# Patient Record
Sex: Female | Born: 1975
Health system: Southern US, Community
[De-identification: ages and names within clinical notes are randomized; demographics above are authoritative.]

## PROBLEM LIST (undated history)

## (undated) DIAGNOSIS — Z299 Encounter for prophylactic measures, unspecified: Secondary | ICD-10-CM

## (undated) DIAGNOSIS — N760 Acute vaginitis: Secondary | ICD-10-CM

## (undated) DIAGNOSIS — IMO0002 Reserved for concepts with insufficient information to code with codable children: Secondary | ICD-10-CM

## (undated) DIAGNOSIS — R51 Headache: Secondary | ICD-10-CM

## (undated) DIAGNOSIS — N809 Endometriosis, unspecified: Secondary | ICD-10-CM

## (undated) DIAGNOSIS — R87619 Unspecified abnormal cytological findings in specimens from cervix uteri: Secondary | ICD-10-CM

## (undated) DIAGNOSIS — T7840XA Allergy, unspecified, initial encounter: Secondary | ICD-10-CM

## (undated) DIAGNOSIS — B9689 Other specified bacterial agents as the cause of diseases classified elsewhere: Secondary | ICD-10-CM

## (undated) DIAGNOSIS — B009 Herpesviral infection, unspecified: Secondary | ICD-10-CM

## (undated) DIAGNOSIS — Z789 Other specified health status: Secondary | ICD-10-CM

## (undated) DIAGNOSIS — D689 Coagulation defect, unspecified: Secondary | ICD-10-CM

## (undated) DIAGNOSIS — I82409 Acute embolism and thrombosis of unspecified deep veins of unspecified lower extremity: Secondary | ICD-10-CM

## (undated) DIAGNOSIS — Z8619 Personal history of other infectious and parasitic diseases: Secondary | ICD-10-CM

## (undated) DIAGNOSIS — Z3490 Encounter for supervision of normal pregnancy, unspecified, unspecified trimester: Secondary | ICD-10-CM

## (undated) HISTORY — DX: Unspecified abnormal cytological findings in specimens from cervix uteri: R87.619

## (undated) HISTORY — DX: Coagulation defect, unspecified: D68.9

## (undated) HISTORY — DX: Encounter for prophylactic measures, unspecified: Z29.9

## (undated) HISTORY — DX: Endometriosis, unspecified: N80.9

## (undated) HISTORY — DX: Personal history of other infectious and parasitic diseases: Z86.19

## (undated) HISTORY — DX: Herpesviral infection, unspecified: B00.9

## (undated) HISTORY — DX: Allergy, unspecified, initial encounter: T78.40XA

## (undated) HISTORY — DX: Acute embolism and thrombosis of unspecified deep veins of unspecified lower extremity: I82.409

## (undated) HISTORY — DX: Reserved for concepts with insufficient information to code with codable children: IMO0002

## (undated) HISTORY — DX: Encounter for supervision of normal pregnancy, unspecified, unspecified trimester: Z34.90

---

## 1998-03-05 ENCOUNTER — Emergency Department (HOSPITAL_COMMUNITY): Admission: EM | Admit: 1998-03-05 | Discharge: 1998-03-05 | Payer: Self-pay

## 1999-04-05 ENCOUNTER — Emergency Department (HOSPITAL_COMMUNITY): Admission: EM | Admit: 1999-04-05 | Discharge: 1999-04-05 | Payer: Self-pay | Admitting: Emergency Medicine

## 1999-04-06 ENCOUNTER — Encounter: Payer: Self-pay | Admitting: Emergency Medicine

## 1999-09-13 ENCOUNTER — Emergency Department (HOSPITAL_COMMUNITY): Admission: EM | Admit: 1999-09-13 | Discharge: 1999-09-13 | Payer: Self-pay | Admitting: *Deleted

## 1999-11-28 ENCOUNTER — Other Ambulatory Visit: Admission: RE | Admit: 1999-11-28 | Discharge: 1999-11-28 | Payer: Self-pay | Admitting: *Deleted

## 1999-11-28 ENCOUNTER — Encounter (INDEPENDENT_AMBULATORY_CARE_PROVIDER_SITE_OTHER): Payer: Self-pay

## 2000-05-11 ENCOUNTER — Other Ambulatory Visit: Admission: RE | Admit: 2000-05-11 | Discharge: 2000-05-11 | Payer: Self-pay | Admitting: Obstetrics and Gynecology

## 2001-04-08 ENCOUNTER — Other Ambulatory Visit: Admission: RE | Admit: 2001-04-08 | Discharge: 2001-04-08 | Payer: Self-pay | Admitting: Obstetrics and Gynecology

## 2002-04-12 ENCOUNTER — Other Ambulatory Visit: Admission: RE | Admit: 2002-04-12 | Discharge: 2002-04-12 | Payer: Self-pay | Admitting: Obstetrics and Gynecology

## 2003-04-25 ENCOUNTER — Other Ambulatory Visit: Admission: RE | Admit: 2003-04-25 | Discharge: 2003-04-25 | Payer: Self-pay | Admitting: Obstetrics and Gynecology

## 2004-04-23 ENCOUNTER — Other Ambulatory Visit: Admission: RE | Admit: 2004-04-23 | Discharge: 2004-04-23 | Payer: Self-pay | Admitting: Obstetrics and Gynecology

## 2005-01-30 ENCOUNTER — Other Ambulatory Visit: Admission: RE | Admit: 2005-01-30 | Discharge: 2005-01-30 | Payer: Self-pay | Admitting: Obstetrics and Gynecology

## 2005-12-25 ENCOUNTER — Other Ambulatory Visit: Admission: RE | Admit: 2005-12-25 | Discharge: 2005-12-25 | Payer: Self-pay | Admitting: Obstetrics and Gynecology

## 2008-05-25 HISTORY — PX: DILATION AND CURETTAGE OF UTERUS: SHX78

## 2008-06-27 ENCOUNTER — Encounter: Admission: RE | Admit: 2008-06-27 | Discharge: 2008-06-27 | Payer: Self-pay | Admitting: Emergency Medicine

## 2009-06-07 ENCOUNTER — Ambulatory Visit (HOSPITAL_COMMUNITY): Admission: RE | Admit: 2009-06-07 | Discharge: 2009-06-07 | Payer: Self-pay | Admitting: Obstetrics and Gynecology

## 2010-05-25 HISTORY — PX: BUNIONECTOMY: SHX129

## 2011-03-06 ENCOUNTER — Other Ambulatory Visit: Payer: Self-pay | Admitting: Internal Medicine

## 2011-03-06 DIAGNOSIS — Z1231 Encounter for screening mammogram for malignant neoplasm of breast: Secondary | ICD-10-CM

## 2011-03-13 ENCOUNTER — Ambulatory Visit
Admission: RE | Admit: 2011-03-13 | Discharge: 2011-03-13 | Disposition: A | Discharge status: Home / Self Care | Payer: 59 | Source: Ambulatory Visit | Attending: Internal Medicine | Admitting: Internal Medicine

## 2011-03-13 DIAGNOSIS — Z1231 Encounter for screening mammogram for malignant neoplasm of breast: Secondary | ICD-10-CM

## 2011-05-26 DIAGNOSIS — I82409 Acute embolism and thrombosis of unspecified deep veins of unspecified lower extremity: Secondary | ICD-10-CM

## 2011-05-26 HISTORY — DX: Acute embolism and thrombosis of unspecified deep veins of unspecified lower extremity: I82.409

## 2011-07-01 ENCOUNTER — Encounter (INDEPENDENT_AMBULATORY_CARE_PROVIDER_SITE_OTHER): Payer: 59 | Admitting: Vascular Surgery

## 2011-07-01 DIAGNOSIS — M79609 Pain in unspecified limb: Secondary | ICD-10-CM

## 2011-07-01 NOTE — Progress Notes (Unsigned)
Performed RLE venous duplex @ [facility] 07/01/2011, gave verbal positive preliminary results to Vikki Ports RN at [facility] @ about 4pm, per Dr. Charlsie Merles instructions patient was sent home and [facility] contacted Dr. Merri Brunette office, the patients PCP,  for them to take over care regarding the positive results. I spoke with Kennon Rounds at Dr. Hulan Fess office and gave verbal positive preliminary results @ about 4:15pm.

## 2011-07-07 NOTE — Procedures (Unsigned)
DUPLEX DEEP VENOUS EXAM - LOWER EXTREMITY  INDICATION:  Right lower extremity pain.  HISTORY:  Edema:  Yes. Trauma/Surgery:  Recent foot surgery. Pain:  Yes. PE:  No. Previous DVT:  No. Anticoagulants:  No. Other:  DUPLEX EXAM:               CFV   SFV   PopV  PTV    GSV               R  L  R  L  R  L  R   L  R  L Thrombosis    o  o  o     +     +      o Spontaneous   +  +  +     o     o      + Phasic        +  +  +     o     o      + Augmentation  +  +  D     o     o      + Compressible  +  +  +     o     o      + Competent     +  +  +                  +  Legend:  + - yes  o - no  p - partial  D - decreased  IMPRESSION: 1. Acute deep venous thrombosis present involving the right distal     superficial femoral vein, popliteal vein, posterior tibial, and     peroneal veins. 2. Remainder of the right lower extremity deep venous system and right     greater saphenous vein appear patent and compressible. 3. Contralateral common femoral vein is patent and compressible.  Preliminary results were given to Elige Radon, at [facility]. Instructions were given for patient to go home, and they would follow up with the patient.   _____________________________ Quita Skye. Hart Rochester, M.D.  SH/MEDQ  D:  07/01/2011  T:  07/01/2011  Job:  [id number]  CC:  Merri Brunette, M.D.

## 2011-08-13 ENCOUNTER — Encounter (INDEPENDENT_AMBULATORY_CARE_PROVIDER_SITE_OTHER): Payer: 59 | Admitting: Obstetrics and Gynecology

## 2011-08-13 DIAGNOSIS — N949 Unspecified condition associated with female genital organs and menstrual cycle: Secondary | ICD-10-CM

## 2011-08-13 DIAGNOSIS — Z202 Contact with and (suspected) exposure to infections with a predominantly sexual mode of transmission: Secondary | ICD-10-CM

## 2011-08-13 DIAGNOSIS — J029 Acute pharyngitis, unspecified: Secondary | ICD-10-CM

## 2011-08-17 ENCOUNTER — Encounter: Payer: 59 | Admitting: Obstetrics and Gynecology

## 2011-12-30 ENCOUNTER — Encounter: Payer: Self-pay | Admitting: Obstetrics and Gynecology

## 2011-12-30 ENCOUNTER — Ambulatory Visit (INDEPENDENT_AMBULATORY_CARE_PROVIDER_SITE_OTHER): Payer: 59 | Admitting: Obstetrics and Gynecology

## 2011-12-30 VITALS — BP 118/88 | Temp 98.1°F | Wt 191.0 lb

## 2011-12-30 DIAGNOSIS — R300 Dysuria: Secondary | ICD-10-CM

## 2011-12-30 DIAGNOSIS — Z113 Encounter for screening for infections with a predominantly sexual mode of transmission: Secondary | ICD-10-CM

## 2011-12-30 DIAGNOSIS — N926 Irregular menstruation, unspecified: Secondary | ICD-10-CM

## 2011-12-30 DIAGNOSIS — Z7901 Long term (current) use of anticoagulants: Secondary | ICD-10-CM | POA: Insufficient documentation

## 2011-12-30 DIAGNOSIS — B373 Candidiasis of vulva and vagina: Secondary | ICD-10-CM

## 2011-12-30 DIAGNOSIS — N898 Other specified noninflammatory disorders of vagina: Secondary | ICD-10-CM

## 2011-12-30 DIAGNOSIS — R102 Pelvic and perineal pain: Secondary | ICD-10-CM

## 2011-12-30 DIAGNOSIS — N949 Unspecified condition associated with female genital organs and menstrual cycle: Secondary | ICD-10-CM

## 2011-12-30 LAB — POCT WET PREP (WET MOUNT)
KOH Wet Prep POC: NEGATIVE
Trichomonas Wet Prep HPF POC: NEGATIVE

## 2011-12-30 LAB — POCT URINALYSIS DIPSTICK
Bilirubin, UA: NEGATIVE
Blood, UA: 50
Ketones, UA: NEGATIVE

## 2011-12-30 MED ORDER — DOXYCYCLINE HYCLATE 100 MG PO TABS: 100.0000 mg | ORAL_TABLET | Freq: Two times a day (BID) | ORAL | Status: AC

## 2011-12-30 NOTE — Progress Notes (Signed)
Color: brown, stringy  Odor: no Itching:yes Thin:yes Thick:no Fever:no Dyspareunia:yes Hx PID:no HX STD:yes genital herpes Pelvic Pain:yes Desires Gc/CT:yes Desires HIV,RPR,HbsAG:yes Pt had paragard inserted at the end of March and states since then she has problems with spotting and discharge after her period.

## 2011-12-30 NOTE — Progress Notes (Signed)
35 YO complains of vaginal irritation and itching for the past week.  Has noticed that after period will notice a brown discharge 4 days later and occasionally after intercourse.  Admits to increased dyspareunia and random pelvic pain that ranges from crampy to sharp and sometimes dull.  Pain is worse with exercise but only lasts for seconds or minutes.  Denies urinary symptoms but admits to problems with constipation.  O: Pelvic:  EGBUS-wnl, vagina-small blood, cervix-string visible, uterus-tender, ULNS,, adnexae-mildly tender bilaterally   Wet Prep: pH-4.5,  whiff-neg,  yeast +++ U/A-negative UPT-negative  A: Yeast Vaginitis     Irregular Bleeding     Anti-coagulated due to DVT     Pelvic Pain     Paragard IUD  P:  GC/CT, RPR, HIV-pending        pelvic U/S-pending           Doxycycline 100 mg  #14 bid x 7 days no refills       Terazol 7 Vaginal Cream # 1 tube 1 applicatorful pv qhs no refill  Dwyne Hasegawa, PA-C

## 2011-12-31 ENCOUNTER — Other Ambulatory Visit: Payer: Self-pay | Admitting: Obstetrics and Gynecology

## 2011-12-31 LAB — GC/CHLAMYDIA PROBE AMP, GENITAL: GC Probe Amp, Genital: NEGATIVE

## 2011-12-31 LAB — RPR

## 2011-12-31 LAB — HIV ANTIBODY (ROUTINE TESTING W REFLEX): HIV: NONREACTIVE

## 2011-12-31 MED ORDER — TERCONAZOLE 0.4 % VA CREA: 1.0000 | TOPICAL_CREAM | Freq: Every day | VAGINAL | Status: AC

## 2012-01-07 ENCOUNTER — Other Ambulatory Visit: Payer: Self-pay | Admitting: Obstetrics and Gynecology

## 2012-01-07 NOTE — Telephone Encounter (Signed)
Triage/rx °

## 2012-01-07 NOTE — Telephone Encounter (Signed)
Spoke with susan from [employer] states pt states suppose to have rx to stop bleeding states pt has rx for doxy and terezol advised susan that per EP note those were the only two rx prescribed at that visit susan will inform pt

## 2012-01-22 ENCOUNTER — Encounter: Payer: Self-pay | Admitting: Obstetrics and Gynecology

## 2012-01-22 ENCOUNTER — Ambulatory Visit (INDEPENDENT_AMBULATORY_CARE_PROVIDER_SITE_OTHER): Payer: 59

## 2012-01-22 ENCOUNTER — Ambulatory Visit (INDEPENDENT_AMBULATORY_CARE_PROVIDER_SITE_OTHER): Payer: 59 | Admitting: Obstetrics and Gynecology

## 2012-01-22 VITALS — BP 122/70 | Temp 99.0°F | Ht 66.0 in | Wt 193.0 lb

## 2012-01-22 DIAGNOSIS — N83209 Unspecified ovarian cyst, unspecified side: Secondary | ICD-10-CM

## 2012-01-22 DIAGNOSIS — N949 Unspecified condition associated with female genital organs and menstrual cycle: Secondary | ICD-10-CM

## 2012-01-22 DIAGNOSIS — R102 Pelvic and perineal pain: Secondary | ICD-10-CM

## 2012-01-22 DIAGNOSIS — Z01419 Encounter for gynecological examination (general) (routine) without abnormal findings: Secondary | ICD-10-CM

## 2012-01-22 DIAGNOSIS — Z124 Encounter for screening for malignant neoplasm of cervix: Secondary | ICD-10-CM

## 2012-01-22 DIAGNOSIS — N83299 Other ovarian cyst, unspecified side: Secondary | ICD-10-CM

## 2012-01-22 MED ORDER — HYDROCODONE-ACETAMINOPHEN 5-500 MG PO TABS: 1.0000 | ORAL_TABLET | Freq: Four times a day (QID) | ORAL | Status: AC | PRN

## 2012-01-22 NOTE — Progress Notes (Signed)
Regular Periods: yes Mammogram: yes  Monthly Breast Ex.: no Exercise: yes  Tetanus < 10 years: no Seatbelts: yes  NI. Bladder Functn.: yes Abuse at home: no  Daily BM's: yes Stressful Work: yes  Healthy Diet: yes Sigmoid-Colonoscopy: NO  Calcium: no Medical problems this year: F/U U/S   LAST PAP:2012  Contraception: PARAGARD IUD  Mammogram:  AGE 36  PCP: DR. PHARR  PMH: PT HAD DVT 2013  [facility]: NO CHANGE  Last Bone Scan: NO

## 2012-01-22 NOTE — Patient Instructions (Addendum)
Ovarian Cyst The ovaries are small organs that are on each side of the uterus. The ovaries are the organs that produce the female hormones, estrogen and progesterone. An ovarian cyst is a sac filled with fluid that can vary in its size. It is normal for a small cyst to form in women who are in the childbearing age and who have menstrual periods. This type of cyst is called a follicle cyst that becomes an ovulation cyst (corpus luteum cyst) after it produces the women's egg. It later goes away on its own if the woman does not become pregnant. There are other kinds of ovarian cysts that may cause problems and may need to be treated. The most serious problem is a cyst with cancer. It should be noted that menopausal women who have an ovarian cyst are at a higher risk of it being a cancer cyst. They should be evaluated very quickly, thoroughly and followed closely. This is especially true in menopausal women because of the high rate of ovarian cancer in women in menopause. CAUSES AND TYPES OF OVARIAN CYSTS:  FUNCTIONAL CYST: The follicle/corpus luteum cyst is a functional cyst that occurs every month during ovulation with the menstrual cycle. They go away with the next menstrual cycle if the woman does not get pregnant. Usually, there are no symptoms with a functional cyst.   ENDOMETRIOMA CYST: This cyst develops from the lining of the uterus tissue. This cyst gets in     or on the ovary. It grows every month from the bleeding during the menstrual period. It is also called a "chocolate cyst" because it becomes filled with blood that turns brown. This cyst can cause pain in the lower abdomen during intercourse and with your menstrual period.   CYSTADENOMA CYST: This cyst develops from the cells on the outside of the ovary. They usually are not cancerous. They can get very big and cause lower abdomen pain and pain with intercourse. This type of cyst can twist on itself, cut off its blood supply and cause severe  pain. It also can easily rupture and cause a lot of pain.   DERMOID CYST: This type of cyst is sometimes found in both ovaries. They are found to have different kinds of body tissue in the cyst. The tissue includes skin, teeth, hair, and/or cartilage. They usually do not have symptoms unless they get very big. Dermoid cysts are rarely cancerous.   POLYCYSTIC OVARY: This is a rare condition with hormone problems that produces many small cysts on both ovaries. The cysts are follicle-like cysts that never produce an egg and become a corpus luteum. It can cause an increase in body weight, infertility, acne, increase in body and facial hair and lack of menstrual periods or rare menstrual periods. Many women with this problem develop type 2 diabetes. The exact cause of this problem is unknown. A polycystic ovary is rarely cancerous.   THECA LUTEIN CYST: Occurs when too much hormone (human chorionic gonadotropin) is produced and over-stimulates the ovaries to produce an egg. They are frequently seen when doctors stimulate the ovaries for invitro-fertilization (test tube babies).   LUTEOMA CYST: This cyst is seen during pregnancy. Rarely it can cause an obstruction to the birth canal during labor and delivery. They usually go away after delivery.  SYMPTOMS   Pelvic pain or pressure.   Pain during sexual intercourse.   Increasing girth (swelling) of the abdomen.   Abnormal menstrual periods.   Increasing pain with menstrual periods.  You stop having menstrual periods and you are not pregnant.  DIAGNOSIS  The diagnosis can be made during:  Routine or annual pelvic examination (common).   Ultrasound.   X-ray of the pelvis.   CT Scan.   MRI.   Blood tests.  TREATMENT   Treatment may only be to follow the cyst monthly for 2 to 3 months with your caregiver. Many go away on their own, especially functional cysts.   May be aspirated (drained) with a long needle with ultrasound, or by  laparoscopy (inserting a tube into the pelvis through a small incision).   The whole cyst can be removed by laparoscopy.   Sometimes the cyst may need to be removed through an incision in the lower abdomen.   Hormone treatment is sometimes used to help dissolve certain cysts.   Birth control pills are sometimes used to help dissolve certain cysts.  HOME CARE INSTRUCTIONS  Follow your caregiver's advice regarding:  Medicine.   Follow up visits to evaluate and treat the cyst.   You may need to come back or make an appointment with another caregiver, to find the exact cause of your cyst, if your caregiver is not a gynecologist.   Get your yearly and recommended pelvic examinations and Pap tests.   Let your caregiver know if you have had an ovarian cyst in the past.  SEEK MEDICAL CARE IF:   Your periods are late, irregular, they stop, or are painful.   Your stomach (abdomen) or pelvic pain does not go away.   Your stomach becomes larger or swollen.   You have pressure on your bladder or trouble emptying your bladder completely.   You have painful sexual intercourse.   You have feelings of fullness, pressure, or discomfort in your stomach.   You lose weight for no apparent reason.   You feel generally ill.   You become constipated.   You lose your appetite.   You develop acne.   You have an increase in body and facial hair.   You are gaining weight, without changing your exercise and eating habits.   You think you are pregnant.  SEEK IMMEDIATE MEDICAL CARE IF:   You have increasing abdominal pain.   You feel sick to your stomach (nausea) and/or vomit.   You develop a fever that comes on suddenly.   You develop abdominal pain during a bowel movement.   Your menstrual periods become heavier than usual.  Document Released: 05/11/2005 Document Revised: 04/30/2011 Document Reviewed: 03/14/2009 [facility] Patient Information 2012 [place], [place].  Call office if you  have pain that does not improve with your pain medication or pain that is severe.

## 2012-01-22 NOTE — Progress Notes (Signed)
Subjective:    Kara Duffy is a 36 y.o. female, G1P0, who presents for an annual exam. The patient was seen 12/30/11 for pelvic pain and had an ultrasound today th at showed a complex left ovarian cyst-2.5 x 2.0 x 1.9 cm (dermoid cannot be ruled out).  Patient's IUD was in the proper position within the endometerial cavity per 3 D rendering.  Patient continues to have episodes of severe pelvic pain and with intercourse horrible.  This month has been the worse for her pain but it occurs randomly.   Admits that pain is aggravated by certain movements but knows of no initiating factors.   Menstrual cycle:   LMP: Patient's last menstrual period was 01/14/2012.              Review of Systems Pertinent items are noted in HPI. Denies pelvic pain, urinary tract symptoms, vaginitis symptoms, irregular bleeding, menopausal symptoms, change in bowel habits or rectal bleeding   Objective:    BP 122/70  Temp 99 F (37.2 C) (Oral)  Ht 5\' 6"  (1.676 m)  Wt 193 lb (87.544 kg)  BMI 31.15 kg/m2  LMP 01/14/2012   @Weigh @t :  Wt Readings from Last 1 Encounters:  01/22/12 193 lb (87.544 kg)   Body mass index is 31.15 kg/(m^2). General Appearance: Alert, no acute distress HEENT: Grossly normal Neck / Thyroid: Supple, no thyromegaly or cervical adenopathy Lungs: Clear to auscultation bilaterally Back: No CVA tenderness Breast Exam: No masses or nodes.No dimpling, nipple retraction or discharge. Cardiovascular: Regular rate and rhythm.  Gastrointestinal: Soft, non-tender, no masses or organomegaly Pelvic Exam: EGBUS-wnl, vagina-normal rugae, scant blood, cervix- without lesions or tenderness, string visible, uterus appears normal size shape and consistency, adnexae-no masses or tenderness Lymphatic Exam: Non-palpable nodes in neck, clavicular,  axillary, or inguinal regions  Skin: no rashes or abnormalities Extremities: no clubbing cyanosis or edema  Neurologic: grossly normal Psychiatric: Alert and  oriented    Assessment:   Routine GYN Exam Complex Left Ovarian Cyst Pelvic Pain Anticoagulated due to DVT   Plan:  Torsion precautions; reviewed management options  Vicodin # 30  1 po q 4-6 hours prn-pain no refills  PAP sent  Repeat U/S in 6 weeks  RTO 1 year or prn  Kameko Hukill,ELMIRAPA-C

## 2012-01-26 LAB — PAP IG W/ RFLX HPV ASCU

## 2012-03-07 ENCOUNTER — Encounter: Payer: Self-pay | Admitting: Obstetrics and Gynecology

## 2012-03-07 ENCOUNTER — Ambulatory Visit (INDEPENDENT_AMBULATORY_CARE_PROVIDER_SITE_OTHER): Payer: 59

## 2012-03-07 ENCOUNTER — Ambulatory Visit (INDEPENDENT_AMBULATORY_CARE_PROVIDER_SITE_OTHER): Payer: 59 | Admitting: Obstetrics and Gynecology

## 2012-03-07 VITALS — BP 112/70 | Ht 66.0 in | Wt 196.0 lb

## 2012-03-07 DIAGNOSIS — N83299 Other ovarian cyst, unspecified side: Secondary | ICD-10-CM

## 2012-03-07 DIAGNOSIS — N83202 Unspecified ovarian cyst, left side: Secondary | ICD-10-CM

## 2012-03-07 DIAGNOSIS — N83209 Unspecified ovarian cyst, unspecified side: Secondary | ICD-10-CM

## 2012-03-07 DIAGNOSIS — R102 Pelvic and perineal pain: Secondary | ICD-10-CM

## 2012-03-07 DIAGNOSIS — N949 Unspecified condition associated with female genital organs and menstrual cycle: Secondary | ICD-10-CM

## 2012-03-07 NOTE — Progress Notes (Signed)
Here to f/u u/s  Filed Vitals:   03/07/12 1643  BP: 112/70   Ultrasound today left ovarian cyst is unchanged with a left ovarian complex cyst that had the appearance of an endometrioma uterus measures 7.3 x 3.7 x 4.5 cm and right ovary measures 2.5 cm the left ovary was 3.9 cm  A/P Options and recs discussed Pt wants to discuss with husband Endometrioma - Plan will be if pt decides to proceed  - lap left cystectomy/ poss oophorectomy

## 2012-03-09 ENCOUNTER — Telehealth: Payer: Self-pay | Admitting: Obstetrics and Gynecology

## 2012-03-09 NOTE — Telephone Encounter (Signed)
Laparoscopic Left Cystectomy possible Oopherectomy scheduled for 03/30/12@ 9:00 with AR/EP. [employer] effective 05/25/09.  Plan pays 85/15 after a $1,250 deducitble. Pre-cert not required, Pre-op due $199.20 -Adrianne Pridgen

## 2012-03-11 ENCOUNTER — Other Ambulatory Visit: Payer: Self-pay | Admitting: Obstetrics and Gynecology

## 2012-03-15 ENCOUNTER — Telehealth: Payer: Self-pay | Admitting: Obstetrics and Gynecology

## 2012-03-15 ENCOUNTER — Encounter: Payer: Self-pay | Admitting: Obstetrics and Gynecology

## 2012-03-15 ENCOUNTER — Ambulatory Visit (INDEPENDENT_AMBULATORY_CARE_PROVIDER_SITE_OTHER): Payer: 59 | Admitting: Obstetrics and Gynecology

## 2012-03-15 ENCOUNTER — Encounter (HOSPITAL_COMMUNITY): Payer: Self-pay | Admitting: Pharmacist

## 2012-03-15 VITALS — BP 120/76 | Wt 197.0 lb

## 2012-03-15 DIAGNOSIS — A499 Bacterial infection, unspecified: Secondary | ICD-10-CM

## 2012-03-15 DIAGNOSIS — N898 Other specified noninflammatory disorders of vagina: Secondary | ICD-10-CM

## 2012-03-15 DIAGNOSIS — B379 Candidiasis, unspecified: Secondary | ICD-10-CM

## 2012-03-15 DIAGNOSIS — B49 Unspecified mycosis: Secondary | ICD-10-CM

## 2012-03-15 DIAGNOSIS — N760 Acute vaginitis: Secondary | ICD-10-CM

## 2012-03-15 LAB — POCT URINALYSIS DIPSTICK
Glucose, UA: NEGATIVE
Nitrite, UA: NEGATIVE
Urobilinogen, UA: NEGATIVE

## 2012-03-15 MED ORDER — METRONIDAZOLE 500 MG PO TABS: 500.0000 mg | ORAL_TABLET | Freq: Two times a day (BID) | ORAL | Status: DC

## 2012-03-15 MED ORDER — TERCONAZOLE 0.8 % VA CREA: 1.0000 | TOPICAL_CREAM | Freq: Every day | VAGINAL | Status: DC

## 2012-03-15 NOTE — Progress Notes (Signed)
Odor: no Fever: no Pelvic Pain: no  Itching: yes Dyspareunia: no Desires GC/CT: yes  Thin: yes History of PID: no Desires HIV,RPR,HbsAG: no  Thick: no History of STD: yes Other: Hsv 2   Vaginal Discharge/Discomfort/Itching  Subjective:   Kara Duffy is an 36 y.o. woman who presents c/o burning, itching   Symptoms have been present for 2 weeks. Associated symptoms: burning and general discomfort with change in vaginal secretions.  Contraception: condoms.  History of abnormal Pap smear: no STD history:  none Sexually transmitted infection risk: very low risk of STD exposure but has a history of HSV 2 Menstrual flow: regular every 28-30 days.  Pain: Labia are irritated from using vaginal wipes  Objective: no lesions, no discharge, no CMT, no adnexal masses B and discharge vaginal walls are irritated and the speculum examination was uncomfortable for this patient Vulvar exudate: pH 4.0 Vaginal lesions: none noted Wet Prep: PH 4.0, Yeast and Clue Cells noted  Assessment: Yeast and BV infection  Plan: Additional Tests:GC and Chlamydia genprobes Medications: antifungal and metronidazole Counseling:  diagnosis, lab results, treatment options, follow up plan and return instructions   Follow-up: as needed  DAVIES, DENISE, CNM10/22/2013 12:05 PM

## 2012-03-16 ENCOUNTER — Telehealth: Payer: Self-pay | Admitting: Obstetrics and Gynecology

## 2012-03-16 LAB — GC/CHLAMYDIA PROBE AMP
CT Probe RNA: NEGATIVE
GC Probe RNA: NEGATIVE

## 2012-03-17 NOTE — Telephone Encounter (Signed)
TC TO PT REGARDING MESSAGE. PT WANTED TO KNOW HOW LONG WILL SHE POSSIBLY BE OUT FOR SURGERY.I CALLED  ADRIANNE WHO CALLED AR AND AR SAID ABOUT 2 WEEKS PT WOULD BE OUT OF WORK. PT VOICED UNDERSTANDING.

## 2012-03-23 ENCOUNTER — Encounter (HOSPITAL_COMMUNITY)
Admission: RE | Admit: 2012-03-23 | Discharge: 2012-03-23 | Disposition: A | Discharge status: Home / Self Care | Payer: 59 | Source: Ambulatory Visit | Attending: Obstetrics and Gynecology | Admitting: Obstetrics and Gynecology

## 2012-03-23 ENCOUNTER — Encounter: Payer: 59 | Admitting: Obstetrics and Gynecology

## 2012-03-23 ENCOUNTER — Encounter (HOSPITAL_COMMUNITY): Payer: Self-pay

## 2012-03-23 HISTORY — DX: Other specified health status: Z78.9

## 2012-03-23 LAB — CBC
MCH: 30.2 pg (ref 26.0–34.0)
MCV: 89.9 fL (ref 78.0–100.0)
Platelets: 268 10*3/uL (ref 150–400)
RBC: 3.97 MIL/uL (ref 3.87–5.11)
RDW: 12.7 % (ref 11.5–15.5)

## 2012-03-23 NOTE — Patient Instructions (Addendum)
Your procedure is scheduled on:03/30/12  Enter through the Main Entrance at :0730am Pick up desk phone and dial [phone] and inform us of your arrival.  Please call [phone] if you have any problems the morning of surgery.  Remember: Do not eat or drink after midnight:Tuesday   Take these meds the morning of surgery with a sip of water:none  DO NOT wear jewelry, eye make-up, lipstick,body lotion, or dark fingernail polish. Do not shave for 48 hours prior to surgery.  Patients discharged on the day of surgery will not be allowed to drive home.

## 2012-03-24 ENCOUNTER — Other Ambulatory Visit: Payer: Self-pay | Admitting: Obstetrics and Gynecology

## 2012-03-24 ENCOUNTER — Ambulatory Visit (INDEPENDENT_AMBULATORY_CARE_PROVIDER_SITE_OTHER): Payer: 59 | Admitting: Obstetrics and Gynecology

## 2012-03-24 ENCOUNTER — Encounter: Payer: Self-pay | Admitting: Obstetrics and Gynecology

## 2012-03-24 ENCOUNTER — Ambulatory Visit (INDEPENDENT_AMBULATORY_CARE_PROVIDER_SITE_OTHER): Payer: 59

## 2012-03-24 VITALS — BP 110/72 | HR 80 | Temp 98.9°F | Resp 16 | Ht 66.0 in | Wt 199.0 lb

## 2012-03-24 DIAGNOSIS — N949 Unspecified condition associated with female genital organs and menstrual cycle: Secondary | ICD-10-CM

## 2012-03-24 DIAGNOSIS — N83209 Unspecified ovarian cyst, unspecified side: Secondary | ICD-10-CM

## 2012-03-24 DIAGNOSIS — N83299 Other ovarian cyst, unspecified side: Secondary | ICD-10-CM

## 2012-03-24 DIAGNOSIS — R102 Pelvic and perineal pain: Secondary | ICD-10-CM

## 2012-03-24 DIAGNOSIS — Z01818 Encounter for other preprocedural examination: Secondary | ICD-10-CM

## 2012-03-24 NOTE — Progress Notes (Addendum)
Kara Duffy is a 36 y.o. female G1P0 who presents for a Laparoscopic ovarian cystectomy because of pelvic pain and a persistent left ovarian cyst. For over six months the patient has experienced  random pelvic pain that would intensify with menses. Since August however, that pain became worse and almost daily with no relief from over the counter analgesia.  This pain, rated as a 10/10 on a 10 point pain scale, is made worse by walking and standing upright.  Though she's had no urinary tract, vaginitis or bowel symptoms she has experienced dyspareunia. A pelvic ultrasound in October showed a uterus 5.66 x 5.28 x 4.57 cm with IUD in proper position within uterine cavity , right ovary 2.61 x 1.65 x 1.27 cm and left ovary 3.68 x 2.49 x 2.48 cm with a complex cyst 2.2 x 2.1 cm that was suggestive of an endometrioma [cyst on August study measured 2.5 x 1.9 cm].  Given the persistence of this cyst and worsening pelvic pain the patient has decided to proceed with surgical management of her cyst.   Past Medical History  OB History: G1P0   GYN History: menarche: 36 YO;  LMP: 02/1812;  Contraception: ParaGard IUD  The patient reports a past history of: chlamydia, herpes and HPV.  Has a  history  of CIN-I;  Last PAP smear: August 2013-normal  Medical History:  migraines, right lower extremity DVT (2013, treated with Xarelto x 8 months)  Surgical History:  2010 D & C; 2012 Right Bunionectomy Denies problems with anesthesia or history of blood transfusions  Family History:  Diabetes, hypertension and hyperlipidemia  Social History:   Engaged and employed by [employer]; Denies tobacco or illicit drug use but  consumes alcohol twice a week.   Outpatient Encounter Prescriptions as of 03/24/2012  Medication Sig Dispense Refill  . acetaminophen (TYLENOL) 500 MG tablet Take 1,000 mg by mouth every 6 (six) hours as needed. For headache/pain      . HYDROcodone-acetaminophen (VICODIN) 5-500 MG per  tablet Take 1 tablet by mouth every 6 (six) hours as needed.      Marland Kitchen ibuprofen (ADVIL,MOTRIN) 200 MG tablet Take 600 mg by mouth every 6 (six) hours as needed. For headache.      . valACYclovir (VALTREX) 1000 MG tablet Take 1,000 mg by mouth 2 (two) times daily.      . miconazole (MONISTAT 7) 2 % vaginal cream Place 1 applicator vaginally at bedtime.        No Known Allergies  Denies sensitivity to peanuts, shellfish, soy, or  latex.    Admits mild sensitivity to adhesives adhesives (cause skin redness and rash).   ROS: Admits to glasses/contact lenses and tongue ring;   Denies headache, vision changes, nasal congestion, dysphagia, tinnitus, dizziness, hoarseness, cough,  chest pain, shortness of breath, nausea, vomiting, diarrhea,constipation,  urinary frequency, urgency  dysuria, hematuria, vaginitis symptoms,  swelling of joints,easy bruising,  myalgias, arthralgias, skin rashes, unexplained weight loss and except as is mentioned in the history of present illness, patient's review of systems is otherwise negative.  Physical Exam    BP 110/72  Pulse 80  Temp 98.9 F (37.2 C) (Oral)  Resp 16  Ht 5\' 6"  (1.676 m)  Wt 199 lb (90.266 kg)  BMI 32.12 kg/m2  LMP 03/01/2012  Neck: supple without masses or thyromegaly Lungs: clear to auscultation Heart: regular rate and rhythm Abdomen: soft, non-tender and no organomegaly Pelvic:EGBUS- wnl; vagina-normal rugae; uterus-normal size, cervix without lesions, string  visible; adnexae-left tenderness and right without tenderness,  no palpable masses on either side. Extremities:  no clubbing, cyanosis or edema   Assesment: Persistent Complex Left Ovarian Cyst                      Pelvic Pain   Disposition:  A discussion was held with patient regarding the indication for her procedure(s) along with the risks, which include but are not limited to: reaction to anesthesia, damage to adjacent organs, infection and excessive bleeding.  Patient  verbalized understanding of these risks and has consented to proceed with a Laparoscopic Left Ovarian Cystectomy with a possible Oophorectomy at [facility] [place], March 30, 2012 at 9 a.m.  CSN# [id number]   Teasha Murrillo J. Lowell Guitar, PA-C  for Dr. Woodroe Mode. Su Hilt

## 2012-03-26 NOTE — H&P (Signed)
Kara Duffy is a 36 y.o. female G1P0 who presents for a laparoscopic ovarian cystectomy because of pelvic pain and a persistent left ovarian cyst. For over six months the patient has experienced  random pelvic pain that would intensify with menses. Since August however, that pain has become worse and almost daily with no relief from over the counter analgesia.  This pain, rated as a 10/10 on a 10 point pain scale, is made worse by walking and standing upright.  Though she's had no urinary tract, vaginitis or bowel symptoms she has experienced dyspareunia. A pelvic ultrasound in October showed a uterus 5.66 x 5.28 x 4.57 cm with IUD in proper position within uterine cavity , right ovary 2.61 x 1.65 x 1.27 cm and left ovary 3.68 x 2.49 x 2.48 cm with a complex cyst 2.2 x 2.1 cm that was suggestive of an endometrioma [cyst on August study measured 2.5 x 1.9 cm].  Given the persistence of this cyst and worsening pelvic pain the patient has decided to proceed with surgical management of her cyst.  Past Medical History  OB History: G1P0   GYN History: menarche: 36 YO;  LMP: 02/1812;  Contraception: ParaGard IUD  The patient reports a past history of: chlamydia, herpes and HPV.  Has a  history  of CIN-I;  Last PAP smear: August 2013-normal  Medical History:  migraines, right lower extremity DVT (2013, treated with Xarelto x 8 months)  Surgical History:  2010 D & C; 2012 Right Bunionectomy Denies problems with anesthesia or history of blood transfusions  Family History:  Diabetes, hypertension and hyperlipidemia  Social History:   Engaged and employed by [employer]; Denies tobacco or illicit drug use but  consumes alcohol twice a week.   Outpatient Encounter Prescriptions as of 03/24/2012 Medication Sig Dispense Refill . acetaminophen (TYLENOL) 500 MG tablet Take 1,000 mg by mouth every 6 (six) hours as needed. For headache/pain     . HYDROcodone-acetaminophen (VICODIN) 5-500 MG per  tablet Take 1 tablet by mouth every 6 (six) hours as needed.     . ibuprofen (ADVIL,MOTRIN) 200 MG tablet Take 600 mg by mouth every 6 (six) hours as needed. For headache.     . valACYclovir (VALTREX) 1000 MG tablet Take 1,000 mg by mouth 2 (two) times daily.     . miconazole (MONISTAT 7) 2 % vaginal cream Place 1 applicator vaginally at bedtime.       No Known Allergies  Denies sensitivity to peanuts, shellfish, soy, or  latex.    Admits mild sensitivity to adhesives adhesives (cause skin redness and rash).   ROS: Admits to glasses/contact lenses and tongue ring;   Denies headache, vision changes, nasal congestion, dysphagia, tinnitus, dizziness, hoarseness, cough,  chest pain, shortness of breath, nausea, vomiting, diarrhea,constipation,  urinary frequency, urgency  dysuria, hematuria, vaginitis symptoms,  swelling of joints,easy bruising,  myalgias, arthralgias, skin rashes, unexplained weight loss and except as is mentioned in the history of present illness, patient's review of systems is otherwise negative.  Physical Exam    BP 110/72  Pulse 80  Temp 98.9 F (37.2 C) (Oral)  Resp 16  Ht 5' 6" (1.676 m)  Wt 199 lb (90.266 kg)  BMI 32.12 kg/m2  LMP 03/01/2012  Neck: supple without masses or thyromegaly Lungs: clear to auscultation Heart: regular rate and rhythm Abdomen: soft, non-tender and no organomegaly Pelvic:EGBUS- wnl; vagina-normal rugae; uterus-normal size, cervix without lesions, string visible; adnexae-left tenderness and right without tenderness,    no palpable masses on either side. Extremities:  no clubbing, cyanosis or edema   Assesment: Persistent Complex Left Ovarian Cyst                      Pelvic Pain   Disposition:  A discussion was held with patient regarding the indication for her procedure(s) along with the risks, which include but are not limited to: reaction to anesthesia, damage to adjacent organs, infection and excessive bleeding.  Patient verbalized  understanding of these risks and has consented to proceed with a Laparoscopic Left Ovarian Cystectomy with a possible Oophorectomy at [facility], March 30, 2012 at 9 a.m.  CSN# [id number]   Terah Robey J. Iveliz Garay, PA-C  for Dr. Angela Y. Roberts  

## 2012-03-30 ENCOUNTER — Encounter (HOSPITAL_COMMUNITY): Payer: Self-pay | Admitting: *Deleted

## 2012-03-30 ENCOUNTER — Encounter (HOSPITAL_COMMUNITY): Payer: Self-pay | Admitting: Anesthesiology

## 2012-03-30 ENCOUNTER — Encounter (HOSPITAL_COMMUNITY)
Admission: RE | Disposition: A | Discharge status: Home / Self Care | Payer: Self-pay | Source: Ambulatory Visit | Attending: Obstetrics and Gynecology

## 2012-03-30 ENCOUNTER — Ambulatory Visit (HOSPITAL_COMMUNITY)
Admission: RE | Admit: 2012-03-30 | Discharge: 2012-03-30 | Disposition: A | Discharge status: Home / Self Care | Payer: 59 | Source: Ambulatory Visit | Attending: Obstetrics and Gynecology | Admitting: Obstetrics and Gynecology

## 2012-03-30 ENCOUNTER — Ambulatory Visit (HOSPITAL_COMMUNITY): Payer: 59 | Admitting: Anesthesiology

## 2012-03-30 DIAGNOSIS — Z01812 Encounter for preprocedural laboratory examination: Secondary | ICD-10-CM | POA: Insufficient documentation

## 2012-03-30 DIAGNOSIS — N809 Endometriosis, unspecified: Secondary | ICD-10-CM

## 2012-03-30 DIAGNOSIS — N949 Unspecified condition associated with female genital organs and menstrual cycle: Secondary | ICD-10-CM | POA: Insufficient documentation

## 2012-03-30 DIAGNOSIS — Z01818 Encounter for other preprocedural examination: Secondary | ICD-10-CM | POA: Insufficient documentation

## 2012-03-30 DIAGNOSIS — N83209 Unspecified ovarian cyst, unspecified side: Secondary | ICD-10-CM | POA: Insufficient documentation

## 2012-03-30 DIAGNOSIS — N801 Endometriosis of ovary: Secondary | ICD-10-CM | POA: Insufficient documentation

## 2012-03-30 DIAGNOSIS — N80109 Endometriosis of ovary, unspecified side, unspecified depth: Secondary | ICD-10-CM | POA: Insufficient documentation

## 2012-03-30 HISTORY — DX: Other specified bacterial agents as the cause of diseases classified elsewhere: B96.89

## 2012-03-30 HISTORY — PX: LAPAROSCOPY: SHX197

## 2012-03-30 HISTORY — PX: OVARIAN CYST REMOVAL: SHX89

## 2012-03-30 HISTORY — DX: Other specified bacterial agents as the cause of diseases classified elsewhere: N76.0

## 2012-03-30 HISTORY — DX: Headache: R51

## 2012-03-30 LAB — PREGNANCY, URINE: Preg Test, Ur: NEGATIVE

## 2012-03-30 SURGERY — LAPAROSCOPY OPERATIVE
Anesthesia: General | Site: Abdomen | Wound class: Clean

## 2012-03-30 MED ORDER — KETOROLAC TROMETHAMINE 30 MG/ML IJ SOLN
INTRAMUSCULAR | Status: DC | PRN
  Administered 2012-03-30: 30 mg via INTRAVENOUS

## 2012-03-30 MED ORDER — FENTANYL CITRATE 0.05 MG/ML IJ SOLN
INTRAMUSCULAR | Status: AC
  Filled 2012-03-30: qty 5

## 2012-03-30 MED ORDER — LACTATED RINGERS IV SOLN
INTRAVENOUS | Status: DC
  Administered 2012-03-30 (×2): 125 mL/h via INTRAVENOUS
  Administered 2012-03-30: 09:00:00 via INTRAVENOUS
  Administered 2012-03-30: 125 mL/h via INTRAVENOUS

## 2012-03-30 MED ORDER — PROPOFOL 10 MG/ML IV EMUL
INTRAVENOUS | Status: DC | PRN
  Administered 2012-03-30: 200 mg via INTRAVENOUS

## 2012-03-30 MED ORDER — MIDAZOLAM HCL 2 MG/2ML IJ SOLN
INTRAMUSCULAR | Status: AC
  Filled 2012-03-30: qty 2

## 2012-03-30 MED ORDER — PROMETHAZINE HCL 25 MG/ML IJ SOLN: 6.2500 mg | INTRAMUSCULAR | Status: DC | PRN

## 2012-03-30 MED ORDER — MIDAZOLAM HCL 2 MG/2ML IJ SOLN: 0.5000 mg | Freq: Once | INTRAMUSCULAR | Status: DC | PRN

## 2012-03-30 MED ORDER — ROCURONIUM BROMIDE 50 MG/5ML IV SOLN
INTRAVENOUS | Status: AC
  Filled 2012-03-30: qty 1

## 2012-03-30 MED ORDER — GLYCOPYRROLATE 0.2 MG/ML IJ SOLN
INTRAMUSCULAR | Status: AC
  Filled 2012-03-30: qty 2

## 2012-03-30 MED ORDER — 0.9 % SODIUM CHLORIDE (POUR BTL) OPTIME
TOPICAL | Status: DC | PRN
  Administered 2012-03-30: 1000 mL

## 2012-03-30 MED ORDER — DEXAMETHASONE SODIUM PHOSPHATE 4 MG/ML IJ SOLN
INTRAMUSCULAR | Status: DC | PRN
  Administered 2012-03-30: 10 mg via INTRAVENOUS

## 2012-03-30 MED ORDER — ONDANSETRON HCL 4 MG/2ML IJ SOLN
INTRAMUSCULAR | Status: AC
  Filled 2012-03-30: qty 2

## 2012-03-30 MED ORDER — DEXAMETHASONE SODIUM PHOSPHATE 10 MG/ML IJ SOLN
INTRAMUSCULAR | Status: AC
  Filled 2012-03-30: qty 1

## 2012-03-30 MED ORDER — FENTANYL CITRATE 0.05 MG/ML IJ SOLN
INTRAMUSCULAR | Status: DC | PRN
  Administered 2012-03-30 (×5): 50 ug via INTRAVENOUS

## 2012-03-30 MED ORDER — HYDROCODONE-ACETAMINOPHEN 5-300 MG PO TABS: 1.0000 | ORAL_TABLET | Freq: Four times a day (QID) | ORAL | Status: DC | PRN

## 2012-03-30 MED ORDER — MEPERIDINE HCL 25 MG/ML IJ SOLN: 6.2500 mg | INTRAMUSCULAR | Status: DC | PRN

## 2012-03-30 MED ORDER — MIDAZOLAM HCL 5 MG/5ML IJ SOLN
INTRAMUSCULAR | Status: DC | PRN
  Administered 2012-03-30: 2 mg via INTRAVENOUS

## 2012-03-30 MED ORDER — BUPIVACAINE HCL (PF) 0.25 % IJ SOLN
INTRAMUSCULAR | Status: DC | PRN
  Administered 2012-03-30: 30 mL

## 2012-03-30 MED ORDER — LACTATED RINGERS IR SOLN
Status: DC | PRN
  Administered 2012-03-30: 3000 mL

## 2012-03-30 MED ORDER — ONDANSETRON HCL 4 MG/2ML IJ SOLN
INTRAMUSCULAR | Status: DC | PRN
  Administered 2012-03-30: 4 mg via INTRAVENOUS

## 2012-03-30 MED ORDER — GLYCOPYRROLATE 0.2 MG/ML IJ SOLN
INTRAMUSCULAR | Status: DC | PRN
  Administered 2012-03-30: 0.4 mg via INTRAVENOUS

## 2012-03-30 MED ORDER — KETOROLAC TROMETHAMINE 30 MG/ML IJ SOLN
INTRAMUSCULAR | Status: AC
  Filled 2012-03-30: qty 1

## 2012-03-30 MED ORDER — NEOSTIGMINE METHYLSULFATE 1 MG/ML IJ SOLN
INTRAMUSCULAR | Status: DC | PRN
  Administered 2012-03-30: 4 mg via INTRAVENOUS

## 2012-03-30 MED ORDER — LIDOCAINE HCL (CARDIAC) 20 MG/ML IV SOLN
INTRAVENOUS | Status: AC
  Filled 2012-03-30: qty 5

## 2012-03-30 MED ORDER — NEOSTIGMINE METHYLSULFATE 1 MG/ML IJ SOLN
INTRAMUSCULAR | Status: AC
  Filled 2012-03-30: qty 10

## 2012-03-30 MED ORDER — BUPIVACAINE HCL (PF) 0.25 % IJ SOLN
INTRAMUSCULAR | Status: AC
  Filled 2012-03-30: qty 30

## 2012-03-30 MED ORDER — IBUPROFEN 600 MG PO TABS: ORAL_TABLET | ORAL | Status: DC

## 2012-03-30 MED ORDER — FENTANYL CITRATE 0.05 MG/ML IJ SOLN: 25.0000 ug | INTRAMUSCULAR | Status: DC | PRN

## 2012-03-30 MED ORDER — HYDROCODONE-ACETAMINOPHEN 5-325 MG PO TABS
ORAL_TABLET | ORAL | Status: AC
  Administered 2012-03-30: 1 via ORAL
  Filled 2012-03-30: qty 1

## 2012-03-30 MED ORDER — PROPOFOL 10 MG/ML IV EMUL
INTRAVENOUS | Status: AC
  Filled 2012-03-30: qty 20

## 2012-03-30 MED ORDER — LIDOCAINE HCL (CARDIAC) 20 MG/ML IV SOLN
INTRAVENOUS | Status: DC | PRN
  Administered 2012-03-30: 50 mg via INTRAVENOUS
  Administered 2012-03-30: 30 mg via INTRAVENOUS

## 2012-03-30 MED ORDER — HYDROCODONE-ACETAMINOPHEN 5-325 MG PO TABS
1.0000 | ORAL_TABLET | Freq: Once | ORAL | Status: AC
  Administered 2012-03-30: 1 via ORAL

## 2012-03-30 MED ORDER — KETOROLAC TROMETHAMINE 30 MG/ML IJ SOLN: 15.0000 mg | Freq: Once | INTRAMUSCULAR | Status: DC | PRN

## 2012-03-30 SURGICAL SUPPLY — 34 items
ADH SKN CLS APL DERMABOND .7 (GAUZE/BANDAGES/DRESSINGS) ×3
BAG SPEC RTRVL LRG 6X4 10 (ENDOMECHANICALS) ×3
CABLE HIGH FREQUENCY MONO STRZ (ELECTRODE) ×2 IMPLANT
CHLORAPREP W/TINT 26ML (MISCELLANEOUS) ×4 IMPLANT
CLOTH BEACON ORANGE TIMEOUT ST (SAFETY) ×4 IMPLANT
DERMABOND ADVANCED (GAUZE/BANDAGES/DRESSINGS) ×1
DERMABOND ADVANCED .7 DNX12 (GAUZE/BANDAGES/DRESSINGS) ×3 IMPLANT
FORCEPS CUTTING 33CM 5MM (CUTTING FORCEPS) ×2 IMPLANT
FORCEPS CUTTING 45CM 5MM (CUTTING FORCEPS) IMPLANT
GLOVE BIO SURGEON STRL SZ7.5 (GLOVE) ×8 IMPLANT
GLOVE BIOGEL PI IND STRL 7.5 (GLOVE) ×3 IMPLANT
GLOVE BIOGEL PI INDICATOR 7.5 (GLOVE) ×1
GOWN PREVENTION PLUS LG XLONG (DISPOSABLE) ×4 IMPLANT
NDL INSUFFLATION 14GA 120MM (NEEDLE) ×2 IMPLANT
NEEDLE INSUFFLATION 14GA 120MM (NEEDLE) ×4 IMPLANT
NS IRRIG 1000ML POUR BTL (IV SOLUTION) ×4 IMPLANT
PACK LAPAROSCOPY BASIN (CUSTOM PROCEDURE TRAY) ×4 IMPLANT
POUCH SPECIMEN RETRIEVAL 10MM (ENDOMECHANICALS) ×2 IMPLANT
PROTECTOR NERVE ULNAR (MISCELLANEOUS) ×6 IMPLANT
SET IRRIG TUBING LAPAROSCOPIC (IRRIGATION / IRRIGATOR) ×2 IMPLANT
SLEEVE Z-THREAD 5X100MM (TROCAR) ×2 IMPLANT
SOLUTION ELECTROLUBE (MISCELLANEOUS) IMPLANT
STRIP CLOSURE SKIN 1/4X4 (GAUZE/BANDAGES/DRESSINGS) ×4 IMPLANT
SUT MON AB 4-0 PS1 27 (SUTURE) ×4 IMPLANT
SUT VICRYL 0 ENDOLOOP (SUTURE) IMPLANT
SUT VICRYL 0 TIES 12 18 (SUTURE) IMPLANT
SUT VICRYL 0 UR6 27IN ABS (SUTURE) ×4 IMPLANT
SYR 50ML LL SCALE MARK (SYRINGE) ×2 IMPLANT
TOWEL OR 17X24 6PK STRL BLUE (TOWEL DISPOSABLE) ×8 IMPLANT
TRAY FOLEY CATH 14FR (SET/KITS/TRAYS/PACK) ×4 IMPLANT
TROCAR Z-THREAD FIOS 11X100 BL (TROCAR) ×4 IMPLANT
TROCAR Z-THREAD FIOS 5X100MM (TROCAR) ×4 IMPLANT
WARMER LAPAROSCOPE (MISCELLANEOUS) ×4 IMPLANT
WATER STERILE IRR 1000ML POUR (IV SOLUTION) ×4 IMPLANT

## 2012-03-30 NOTE — Interval H&P Note (Signed)
History and Physical Interval Note:  03/30/2012 9:01 AM  Kara Duffy  has presented today for surgery, with the diagnosis of Left Ovarian Complex Cyst  The various methods of treatment have been discussed with the patient and family. After consideration of risks, benefits and other options for treatment, the patient has consented to  Procedure(s) (LRB) with comments: LAPAROSCOPY OPERATIVE (N/A) OVARIAN CYSTECTOMY (Left) SALPINGO OOPHERECTOMY (Left) as a surgical intervention .  The patient's history has been reviewed, patient examined, no change in status, stable for surgery.  I have reviewed the patient's chart and labs.  Questions were answered to the patient's satisfaction.     Purcell Nails

## 2012-03-30 NOTE — H&P (Signed)
Kara Duffy is a 36 y.o. female G1P0 who presents for a laparoscopic ovarian cystectomy because of pelvic pain and a persistent left ovarian cyst. For over six months the patient has experienced  random pelvic pain that would intensify with menses. Since August however, that pain has become worse and almost daily with no relief from over the counter analgesia.  This pain, rated as a 10/10 on a 10 point pain scale, is made worse by walking and standing upright.  Though she's had no urinary tract, vaginitis or bowel symptoms she has experienced dyspareunia. A pelvic ultrasound in October showed a uterus 5.66 x 5.28 x 4.57 cm with IUD in proper position within uterine cavity , right ovary 2.61 x 1.65 x 1.27 cm and left ovary 3.68 x 2.49 x 2.48 cm with a complex cyst 2.2 x 2.1 cm that was suggestive of an endometrioma [cyst on August study measured 2.5 x 1.9 cm].  Given the persistence of this cyst and worsening pelvic pain the patient has decided to proceed with surgical management of her cyst.  Past Medical History  OB History: G1P0   GYN History: menarche: 36 YO;  LMP: 02/1812;  Contraception: ParaGard IUD  The patient reports a past history of: chlamydia, herpes and HPV.  Has a  history  of CIN-I;  Last PAP smear: August 2013-normal  Medical History:  migraines, right lower extremity DVT (2013, treated with Xarelto x 8 months)  Surgical History:  2010 D & C; 2012 Right Bunionectomy Denies problems with anesthesia or history of blood transfusions  Family History:  Diabetes, hypertension and hyperlipidemia  Social History:   Engaged and employed by [employer]; Denies tobacco or illicit drug use but  consumes alcohol twice a week.   Outpatient Encounter Prescriptions as of 03/24/2012 Medication Sig Dispense Refill . acetaminophen (TYLENOL) 500 MG tablet Take 1,000 mg by mouth every 6 (six) hours as needed. For headache/pain     . HYDROcodone-acetaminophen (VICODIN) 5-500 MG per  tablet Take 1 tablet by mouth every 6 (six) hours as needed.     . ibuprofen (ADVIL,MOTRIN) 200 MG tablet Take 600 mg by mouth every 6 (six) hours as needed. For headache.     . valACYclovir (VALTREX) 1000 MG tablet Take 1,000 mg by mouth 2 (two) times daily.     . miconazole (MONISTAT 7) 2 % vaginal cream Place 1 applicator vaginally at bedtime.       No Known Allergies  Denies sensitivity to peanuts, shellfish, soy, or  latex.    Admits mild sensitivity to adhesives adhesives (cause skin redness and rash).   ROS: Admits to glasses/contact lenses and tongue ring;   Denies headache, vision changes, nasal congestion, dysphagia, tinnitus, dizziness, hoarseness, cough,  chest pain, shortness of breath, nausea, vomiting, diarrhea,constipation,  urinary frequency, urgency  dysuria, hematuria, vaginitis symptoms,  swelling of joints,easy bruising,  myalgias, arthralgias, skin rashes, unexplained weight loss and except as is mentioned in the history of present illness, patient's review of systems is otherwise negative.  Physical Exam    BP 110/72  Pulse 80  Temp 98.9 F (37.2 C) (Oral)  Resp 16  Ht 5' 6" (1.676 m)  Wt 199 lb (90.266 kg)  BMI 32.12 kg/m2  LMP 03/01/2012  Neck: supple without masses or thyromegaly Lungs: clear to auscultation Heart: regular rate and rhythm Abdomen: soft, non-tender and no organomegaly Pelvic:EGBUS- wnl; vagina-normal rugae; uterus-normal size, cervix without lesions, string visible; adnexae-left tenderness and right without tenderness,    no palpable masses on either side. Extremities:  no clubbing, cyanosis or edema   Assesment: Persistent Complex Left Ovarian Cyst                      Pelvic Pain   Disposition:  A discussion was held with patient regarding the indication for her procedure(s) along with the risks, which include but are not limited to: reaction to anesthesia, damage to adjacent organs, infection and excessive bleeding.  Patient verbalized  understanding of these risks and has consented to proceed with a Laparoscopic Left Ovarian Cystectomy with a possible Oophorectomy at [facility], March 30, 2012 at 9 a.m.  CSN# [id number]   Woodie Trusty J. Phillip Maffei, PA-C  for Dr. Angela Y. Roberts  

## 2012-03-30 NOTE — Anesthesia Postprocedure Evaluation (Signed)
Anesthesia Post Note  Patient: Kara Duffy  Procedure(s) Performed: Procedure(s) (LRB): LAPAROSCOPY OPERATIVE (N/A) OVARIAN CYSTECTOMY (Left)  Anesthesia type: GA  Patient location: PACU  Post pain: Pain level controlled  Post assessment: Post-op Vital signs reviewed  Last Vitals:  Filed Vitals:   03/30/12 1145  BP: 116/70  Pulse: 73  Temp: 37.1 C  Resp: 14    Post vital signs: Reviewed  Level of consciousness: sedated  Complications: No apparent anesthesia complications

## 2012-03-30 NOTE — Anesthesia Preprocedure Evaluation (Signed)
Anesthesia Evaluation  Patient identified by MRN, date of birth, ID band Patient awake    Reviewed: Allergy & Precautions, H&P , Patient's Chart, lab work & pertinent test results, reviewed documented beta blocker date and time   History of Anesthesia Complications Negative for: history of anesthetic complications  Airway Mallampati: II TM Distance: >3 FB Neck ROM: full    Dental No notable dental hx.    Pulmonary neg pulmonary ROS,  breath sounds clear to auscultation  Pulmonary exam normal       Cardiovascular Exercise Tolerance: Good negative cardio ROS  Rhythm:regular Rate:Normal     Neuro/Psych  Headaches, negative neurological ROS  negative psych ROS   GI/Hepatic negative GI ROS, Neg liver ROS,   Endo/Other  negative endocrine ROS  Renal/GU negative Renal ROS     Musculoskeletal   Abdominal   Peds  Hematology negative hematology ROS (+)   Anesthesia Other Findings Herpes simplex without mention of complication     Abnormal Pap smear   cin-I    H/O molluscum contagiosum     H/O chlamydia infection        No pertinent past medical history     DVT (deep venous thrombosis) 2013 right leg - no meds currently    BV (bacterial vaginosis)   2 wks ago Headache    Reproductive/Obstetrics negative OB ROS                           Anesthesia Physical Anesthesia Plan  ASA: II  Anesthesia Plan: General ETT   Post-op Pain Management:    Induction:   Airway Management Planned:   Additional Equipment:   Intra-op Plan:   Post-operative Plan:   Informed Consent: I have reviewed the patients History and Physical, chart, labs and discussed the procedure including the risks, benefits and alternatives for the proposed anesthesia with the patient or authorized representative who has indicated his/her understanding and acceptance.   Dental Advisory Given  Plan Discussed with: CRNA and  Surgeon  Anesthesia Plan Comments:         Anesthesia Quick Evaluation

## 2012-03-30 NOTE — Transfer of Care (Signed)
Immediate Anesthesia Transfer of Care Note  Patient: Kara Duffy  Procedure(s) Performed: Procedure(s) (LRB) with comments: LAPAROSCOPY OPERATIVE (N/A) OVARIAN CYSTECTOMY (Left)  Patient Location: PACU  Anesthesia Type:General  Level of Consciousness: awake, oriented and patient cooperative  Airway & Oxygen Therapy: Patient Spontanous Breathing and Patient connected to nasal cannula oxygen  Post-op Assessment: Report given to PACU RN and Post -op Vital signs reviewed and stable  Post vital signs: Reviewed and stable  Complications: No apparent anesthesia complications

## 2012-03-30 NOTE — H&P (View-Only) (Signed)
Kara Duffy is a 36 y.o. female G1P0 who presents for a laparoscopic ovarian cystectomy because of pelvic pain and a persistent left ovarian cyst. For over six months the patient has experienced  random pelvic pain that would intensify with menses. Since August however, that pain has become worse and almost daily with no relief from over the counter analgesia.  This pain, rated as a 10/10 on a 10 point pain scale, is made worse by walking and standing upright.  Though she's had no urinary tract, vaginitis or bowel symptoms she has experienced dyspareunia. A pelvic ultrasound in October showed a uterus 5.66 x 5.28 x 4.57 cm with IUD in proper position within uterine cavity , right ovary 2.61 x 1.65 x 1.27 cm and left ovary 3.68 x 2.49 x 2.48 cm with a complex cyst 2.2 x 2.1 cm that was suggestive of an endometrioma [cyst on August study measured 2.5 x 1.9 cm].  Given the persistence of this cyst and worsening pelvic pain the patient has decided to proceed with surgical management of her cyst.  Past Medical History  OB History: G1P0   GYN History: menarche: 36 YO;  LMP: 02/1812;  Contraception: ParaGard IUD  The patient reports a past history of: chlamydia, herpes and HPV.  Has a  history  of CIN-I;  Last PAP smear: August 2013-normal  Medical History:  migraines, right lower extremity DVT (2013, treated with Xarelto x 8 months)  Surgical History:  2010 D & C; 2012 Right Bunionectomy Denies problems with anesthesia or history of blood transfusions  Family History:  Diabetes, hypertension and hyperlipidemia  Social History:   Engaged and employed by [employer]; Denies tobacco or illicit drug use but  consumes alcohol twice a week.   Outpatient Encounter Prescriptions as of 03/24/2012 Medication Sig Dispense Refill . acetaminophen (TYLENOL) 500 MG tablet Take 1,000 mg by mouth every 6 (six) hours as needed. For headache/pain     . HYDROcodone-acetaminophen (VICODIN) 5-500 MG per  tablet Take 1 tablet by mouth every 6 (six) hours as needed.     Marland Kitchen ibuprofen (ADVIL,MOTRIN) 200 MG tablet Take 600 mg by mouth every 6 (six) hours as needed. For headache.     . valACYclovir (VALTREX) 1000 MG tablet Take 1,000 mg by mouth 2 (two) times daily.     . miconazole (MONISTAT 7) 2 % vaginal cream Place 1 applicator vaginally at bedtime.       No Known Allergies  Denies sensitivity to peanuts, shellfish, soy, or  latex.    Admits mild sensitivity to adhesives adhesives (cause skin redness and rash).   ROS: Admits to glasses/contact lenses and tongue ring;   Denies headache, vision changes, nasal congestion, dysphagia, tinnitus, dizziness, hoarseness, cough,  chest pain, shortness of breath, nausea, vomiting, diarrhea,constipation,  urinary frequency, urgency  dysuria, hematuria, vaginitis symptoms,  swelling of joints,easy bruising,  myalgias, arthralgias, skin rashes, unexplained weight loss and except as is mentioned in the history of present illness, patient's review of systems is otherwise negative.  Physical Exam    BP 110/72  Pulse 80  Temp 98.9 F (37.2 C) (Oral)  Resp 16  Ht 5\' 6"  (1.676 m)  Wt 199 lb (90.266 kg)  BMI 32.12 kg/m2  LMP 03/01/2012  Neck: supple without masses or thyromegaly Lungs: clear to auscultation Heart: regular rate and rhythm Abdomen: soft, non-tender and no organomegaly Pelvic:EGBUS- wnl; vagina-normal rugae; uterus-normal size, cervix without lesions, string visible; adnexae-left tenderness and right without tenderness,  no palpable masses on either side. Extremities:  no clubbing, cyanosis or edema   Assesment: Persistent Complex Left Ovarian Cyst                      Pelvic Pain   Disposition:  A discussion was held with patient regarding the indication for her procedure(s) along with the risks, which include but are not limited to: reaction to anesthesia, damage to adjacent organs, infection and excessive bleeding.  Patient verbalized  understanding of these risks and has consented to proceed with a Laparoscopic Left Ovarian Cystectomy with a possible Oophorectomy at [facility] [place], March 30, 2012 at 9 a.m.  CSN# [id number]   Kara Duffy J. Lowell Guitar, PA-C  for Dr. Woodroe Mode. Su Hilt

## 2012-03-30 NOTE — Op Note (Signed)
Preop Diagnosis: 1.Left Ovarian Complex Cyst 2.LOA  Postop Diagnosis: 1. Pelvic Pain. 2.Left Ovarian Cyst/Endometrioma   Procedure: LAPAROSCOPIC LEFT OVARIAN CYSTECTOMY   Anesthesia: General   Anesthesiologist: Velna Hatchet, MD   Attending: Purcell Nails, MD   Assistant:  Henreitta Leber, PA-C  Findings: Left ovarian endometrioma (Chocolate Cyst)  Pathology: Portion of left ovary and endometrioma (charred)  Fluids: 2500 cc  UOP: 250 cc  EBL: Minimal (about 50cc)  Complications: None  Procedure: The patient was taken to the operating room after the risks, benefits, alternatives, complications, treatment options, and expected outcomes were discussed with the patient. The patient verbalized understanding, the patient concurred with the proposed plan and consent signed and witnessed. The patient was taken to the Operating Room, identified as Kara Duffy and the procedure verified as laparoscopic bilateral tubal fulguration. A Time Out was held and the above information confirmed.  The patient was placed under general anesthesia per anesthesia staff, the patient was placed in modified dorsal lithotomy position and was prepped, draped, and catheterized in the normal, sterile fashion.  The cervix was visualized and an intrauterine manipulator was placed. A  10 mm umbilical incision was then performed. Veress needle was passed and pneumoperitoneum was established. A 10 mm trocar was advanced into the intraabdominal cavity, the operative laparoscope was introduced and findings as noted above.  Patient was placed in trendelenburg and marcaine injected in the suprapubic area and a 5 mm incision was made and 5 mm trocar advanced into the intraabdominal cavity.  The same was done in the LLQ after identifying the left ovarian endometrioma which was ruptured upon manipulation and lysis of adhesions of the left ovary to fallopian tube and uterine side wall.  The tripolar was then  used to cauterize and cut the endometrioma which was noted to leak chocolate fluid.  There were two small endometriotic implants on the left infundibulopelvic ligament and one was dissected off without difficulty but the other one was left in place secondary to its location.  The base of the ovary was cauterized for hemostasis.  The intraabdominal cavity was copiously irrigated.  Peristalsis of the left ureter was visualized.  The LLQ and suprapubic trocars were removed under direct visualization.  Pneumoperitoneum was relieved and umbilical trocar removed under direct visualization.  The fascia was reapproximated with interrupted stitch of 0 vicryl and the skin repaired with 3-0 monocryl.  All skin incisions were dressed with dermabond.  The foley and hulka tenaculum were removed.  Sponge, lap and needle count was correct.  The patient tolerated the procedure well and was returned to the recovery room in good condition.

## 2012-03-31 ENCOUNTER — Encounter (HOSPITAL_COMMUNITY): Payer: Self-pay | Admitting: Obstetrics and Gynecology

## 2012-04-05 ENCOUNTER — Telehealth: Payer: Self-pay | Admitting: Obstetrics and Gynecology

## 2012-04-05 NOTE — Telephone Encounter (Signed)
Called pt to let her know that a RF on Vicodin # 30  1-2 tabs q 6 hrs prn 0 RF's  Called to her pharmacy. [name] A

## 2012-04-07 ENCOUNTER — Telehealth: Payer: Self-pay | Admitting: Obstetrics and Gynecology

## 2012-04-07 NOTE — Telephone Encounter (Signed)
Routed to [facility].

## 2012-04-07 NOTE — Telephone Encounter (Signed)
Spoke to pt who states she has some brownish tinged d/c persisting s/p surgery.  No fever, or pusy d/c, slight odor, but nothing abnl for post surgical healing. I told pt to report if anything changes, but everything sounds normal at this point. [name] A

## 2012-04-19 ENCOUNTER — Ambulatory Visit (INDEPENDENT_AMBULATORY_CARE_PROVIDER_SITE_OTHER): Payer: 59 | Admitting: Obstetrics and Gynecology

## 2012-04-19 ENCOUNTER — Encounter: Payer: Self-pay | Admitting: Obstetrics and Gynecology

## 2012-04-19 VITALS — BP 104/70 | Temp 98.8°F | Wt 198.0 lb

## 2012-04-19 DIAGNOSIS — Z975 Presence of (intrauterine) contraceptive device: Secondary | ICD-10-CM

## 2012-04-19 DIAGNOSIS — N801 Endometriosis of ovary: Secondary | ICD-10-CM

## 2012-04-19 NOTE — Progress Notes (Signed)
DATE OF SURGERY: 03-30-12 TYPE OF SURGERY:LAPAROCOPIC LEFT CYSTECTOMY  PAIN:No VAG BLEEDING: no VAG DISCHARGE: no NORMAL GI FUNCTN: yes NORMAL GU FUNCTN: yes  PT STATES BELOW  HER NAVEL SHE HAVE A LOT OF ITCHING.  Path Ovary, left, and cyst - OVARIAN TISSUE WITH ENDOSALPINGIOSIS AND ASSOCIATED HEMORRHAGE AND HEMOSIDERIN DEPOSITION, NO ATYPIA OR MALIGNANCY.  ROS: noncontributory  Pelvic exam:  VULVA: normal appearing vulva with no masses, tenderness or lesions,  VAGINA: normal appearing vagina with normal color and discharge, no lesions, CERVIX: normal appearing cervix without discharge or lesions, IUD string visible (paragard) UTERUS: uterus is normal size, shape, consistency and nontender,  ADNEXA: normal adnexa in size, nontender and no masses.  Incisions healing well  A/P Endometrioma and appearance of endometriosis on left side wall May have difficulty with conceiving secondary to endometriosis.  If no conception in 6-91mths of trying, rto.   Specimen Gross and Clinical Information

## 2012-04-25 ENCOUNTER — Ambulatory Visit (INDEPENDENT_AMBULATORY_CARE_PROVIDER_SITE_OTHER): Payer: 59 | Admitting: Obstetrics and Gynecology

## 2012-04-25 ENCOUNTER — Encounter: Payer: Self-pay | Admitting: Obstetrics and Gynecology

## 2012-04-25 ENCOUNTER — Telehealth: Payer: Self-pay | Admitting: Obstetrics and Gynecology

## 2012-04-25 VITALS — BP 128/72 | Temp 98.5°F | Ht 66.0 in | Wt 198.0 lb

## 2012-04-25 DIAGNOSIS — Z9889 Other specified postprocedural states: Secondary | ICD-10-CM

## 2012-04-25 NOTE — Telephone Encounter (Signed)
Spoke to pt to let her know that Dr. Su Hilt can see her today if she can get here pretty soon. Pt will come with in the half hr. Alvino Chapel

## 2012-04-25 NOTE — Telephone Encounter (Signed)
AR pt 

## 2012-04-25 NOTE — Progress Notes (Signed)
DATE OF SURGERY: 03/30/2012 TYPE OF SURGERY;Laparoscopic cystectomy PAIN:Yes  Itching and burning in one spot x 2 weeks  VAG BLEEDING: yes on  cycle VAG DISCHARGE: no NORMAL GI FUNCTN: yes NORMAL GU FUNCTN: yes  C/o discomfort at incision still.  Reports itching and scratching a lot and used hydrocortisone cream on the area.  Filed Vitals:   04/25/12 1042  BP: 128/72  Temp: 98.5 F (36.9 C)   Area appears to be healing well No erythema or tenderness Area does look slightly hyperpigmented which was not there at postop appt last wk  A/P May want to d/c hydrocortisone RTO in 6-8 if hasn't conceived and wants to move forward with infertility w/u

## 2012-05-11 ENCOUNTER — Telehealth: Payer: Self-pay

## 2012-05-11 NOTE — Telephone Encounter (Signed)
I called pt to check to see how she is doing. Pt no longer has any concerns about the area that was bothering her a couple of weeks ago. Kara Duffy A

## 2012-05-31 DIAGNOSIS — N801 Endometriosis of ovary: Secondary | ICD-10-CM

## 2012-06-23 ENCOUNTER — Telehealth: Payer: Self-pay | Admitting: Obstetrics and Gynecology

## 2012-06-23 ENCOUNTER — Encounter: Payer: Self-pay | Admitting: Obstetrics and Gynecology

## 2012-06-23 ENCOUNTER — Ambulatory Visit: Payer: 59 | Admitting: Obstetrics and Gynecology

## 2012-06-23 VITALS — BP 110/72 | Resp 16 | Wt 195.0 lb

## 2012-06-23 DIAGNOSIS — N898 Other specified noninflammatory disorders of vagina: Secondary | ICD-10-CM

## 2012-06-23 DIAGNOSIS — Z202 Contact with and (suspected) exposure to infections with a predominantly sexual mode of transmission: Secondary | ICD-10-CM

## 2012-06-23 DIAGNOSIS — IMO0002 Reserved for concepts with insufficient information to code with codable children: Secondary | ICD-10-CM

## 2012-06-23 LAB — POCT URINALYSIS DIPSTICK
Blood, UA: NEGATIVE
Ketones, UA: NEGATIVE
Protein, UA: NEGATIVE
Spec Grav, UA: 1.015
pH, UA: 6

## 2012-06-23 LAB — POCT WET PREP (WET MOUNT)

## 2012-06-23 MED ORDER — AMBULATORY NON FORMULARY MEDICATION: 600.0000 mg | Status: DC

## 2012-06-23 MED ORDER — METRONIDAZOLE 500 MG PO TABS: 500.0000 mg | ORAL_TABLET | Freq: Two times a day (BID) | ORAL | Status: DC

## 2012-06-23 MED ORDER — FLUCONAZOLE 150 MG PO TABS: 150.0000 mg | ORAL_TABLET | Freq: Once | ORAL | Status: DC

## 2012-06-23 NOTE — Patient Instructions (Addendum)
[facility] pharmacies that carry Boric Acid Capsules/Suppositories:  [facility] [address] (only)  [phone]  [facility]  [address] [facility])  [phone]  [facility])  [address] [phone]  [facility]  [address] [phone]  [facility] [address] [place], [place] [phone]    Endometriosis Endometriosis is a disease that occurs when the endometrium (lining of the uterus) is misplaced outside of its normal location. It may occur in many locations close to the uterus (womb), but commonly on the ovaries, fallopian tubes, vagina (birth canal) and bowel located close to the uterus. Because the uterus sloughs (expels) its lining every month (menses), there is bleeding whereever the endometrial tissue is located. SYMPTOMS  Often there are no symptoms. However, because blood is irritating to tissues not normally exposed to it, when symptoms occur they vary with the location of the misplaced endometrium. Symptoms often include back and abdominal pain. Periods may be heavier and intercourse may be painful. Infertility may be present. You may have all of these symptoms at one time or another or you may have months with no symptoms at all. Although the symptoms occur mainly during menses, they can occur mid-cycle as well, and usually terminate with menopause. DIAGNOSIS  Your caregiver may recommend a blood test and urine test (urinalysis) to help rule out other conditions. Another common test is ultrasound, a painless procedure that uses sound waves to make a sonogram "picture" of abnormal tissue that could be endometriosis. If your bowel movements are painful around your periods, your caregiver may advise a barium enema (an X-ray of the lower bowel), to try to find the source of your pain. This is sometimes confirmed by laparoscopy. Laparoscopy  is a procedure where your caregiver looks into your abdomen with a laparoscope (a small pencil sized telescope). Your caregiver may take a tiny piece of tissue (biopsy) from any abnormal tissue to confirm or document your problem. These tissues are sent to the lab and a pathologist looks at them under the microscope to give a microscopic diagnosis. TREATMENT  Once the diagnosis is made, it can be treated by destruction of the misplaced endometrial tissue using heat (diathermy), laser, cutting (excision), or chemical means. It may also be treated with hormonal therapy. When using hormonal therapy menses are eliminated, therefore eliminating the monthly exposure to blood by the misplaced endometrial tissue. Only in severe cases is it necessary to perform a hysterectomy with removal of the tubes, uterus and ovaries. HOME CARE INSTRUCTIONS   Only take over-the-counter or prescription medicines for pain, discomfort, or fever as directed by your caregiver.  Avoid activities that produce pain, including a physical sexual relationship.  Do not take aspirin as this may increase bleeding when not on hormonal therapy.  See your caregiver for pain or problems not controlled with treatment. SEEK IMMEDIATE MEDICAL CARE IF:   Your pain is severe and is not responding to pain medicine.  You develop severe nausea and vomiting, or you cannot keep foods down.  Your pain localizes to the right lower part of your abdomen (possible appendicitis).  You have swelling or increasing pain in the abdomen.  You have a fever.  You see blood in your stool. MAKE SURE YOU:   Understand these instructions.  Will watch your condition.  Will get help right away if you are not doing well or get worse. Document Released: 05/08/2000 Document Revised: 08/03/2011  Document Reviewed: 12/28/2007 [facility] Patient Information 2013 [place], [place].

## 2012-06-23 NOTE — Progress Notes (Signed)
Vaginal discharge: varies (brown, white, yellow) thin mucoid Itching / Burning: yes Fever: no  Symptoms have been present for since IUD was inserted  07/20/11. Has used over-the-counter treatment: no Associated symptoms:  Pelvic pain: no       Dyspareunia: yes     Odor:  yes  History of STD: gential herpes STD screen:requested

## 2012-06-23 NOTE — Progress Notes (Signed)
37 YO for STD testing and complains of a sticky vaginal discharge.  Also reports a strange discharge of plops of dark discharge that falls out since IUD insertion.  Patient was diagnosed with endometriosis in October and reviewed the pathophysiology of the same along with medical and surgical management options (she had an endometrioma removed).  Patient is S/P right lower extremity DVT and cannot use hormonal contraception-Has a Paragard IUD.  Explained that endometriosis could account for her random spotting, pelvic discomfort and vaginal discharge characteristics.  Spent 15 minutes discussing this matter.  O:Pelvic: EGBUS-wnl, vagina-small mucoid beige discharge, cervix-string visible, uterus-normal size with mild tenderness,     adnexae-no tenderness or masses  Wet Prep:  pH-5.0,  whiff-positive, few clue cells U/A-negative UPT-negative  A: Bacterial Vaginosis     Endometriosis     Paragard IUD     S/P Right Lower Extremity DVT  P: Metronidazole 500 mg  #14 bid x 7 days       Boric Acid 600 mg #30 1 pv  twice a week x 4 weeks then prn 11 refills        Diflcucan 150 mg  #1 1 po stat  1 refill       RTO-as scheduled or prn  Yianna Tersigni, PA-C

## 2012-06-23 NOTE — Telephone Encounter (Signed)
Spoke with pt rgd msg pt c/o d/c and odor wants eval with ep pt has appt 06/23/12 at 4:00 pt voice understanidng

## 2012-06-24 LAB — GC/CHLAMYDIA PROBE AMP: CT Probe RNA: NEGATIVE

## 2012-06-24 LAB — HEPATITIS B SURFACE ANTIGEN: Hepatitis B Surface Ag: NEGATIVE

## 2012-06-24 LAB — HIV ANTIBODY (ROUTINE TESTING W REFLEX): HIV: NONREACTIVE

## 2013-02-27 ENCOUNTER — Other Ambulatory Visit: Payer: Self-pay

## 2013-02-27 DIAGNOSIS — Z1231 Encounter for screening mammogram for malignant neoplasm of breast: Secondary | ICD-10-CM

## 2013-03-22 ENCOUNTER — Ambulatory Visit
Admission: RE | Admit: 2013-03-22 | Discharge: 2013-03-22 | Disposition: A | Discharge status: Home / Self Care | Payer: Managed Care, Other (non HMO) | Source: Ambulatory Visit

## 2013-03-22 DIAGNOSIS — Z1231 Encounter for screening mammogram for malignant neoplasm of breast: Secondary | ICD-10-CM

## 2013-04-08 ENCOUNTER — Ambulatory Visit: Payer: Commercial Indemnity | Admitting: Family Medicine

## 2013-04-08 VITALS — BP 154/84 | HR 100 | Temp 100.4°F | Resp 18 | Ht 67.0 in | Wt 193.6 lb

## 2013-04-08 DIAGNOSIS — R059 Cough, unspecified: Secondary | ICD-10-CM

## 2013-04-08 DIAGNOSIS — B9789 Other viral agents as the cause of diseases classified elsewhere: Secondary | ICD-10-CM

## 2013-04-08 DIAGNOSIS — R05 Cough: Secondary | ICD-10-CM

## 2013-04-08 DIAGNOSIS — B349 Viral infection, unspecified: Secondary | ICD-10-CM

## 2013-04-08 DIAGNOSIS — R269 Unspecified abnormalities of gait and mobility: Secondary | ICD-10-CM

## 2013-04-08 DIAGNOSIS — J09X2 Influenza due to identified novel influenza A virus with other respiratory manifestations: Secondary | ICD-10-CM

## 2013-04-08 MED ORDER — HYDROCODONE-HOMATROPINE 5-1.5 MG/5ML PO SYRP: 5.0000 mL | ORAL_SOLUTION | ORAL | Status: DC | PRN

## 2013-04-08 MED ORDER — OSELTAMIVIR PHOSPHATE 75 MG PO CAPS: 75.0000 mg | ORAL_CAPSULE | Freq: Two times a day (BID) | ORAL | Status: DC

## 2013-04-08 NOTE — Patient Instructions (Signed)
Influenza A (H1N1) H1N1 formerly called "swine flu" is a new influenza virus causing sickness in people. The H1N1 virus is different from seasonal influenza viruses. However, the H1N1 symptoms are similar to seasonal influenza and it is spread from person to person. You may be at higher risk for serious problems if you have underlying serious medical conditions. The CDC and the World Health Organization are following reported cases around the world. CAUSES   The flu is thought to spread mainly person-to-person through coughing or sneezing of infected people.  A person may become infected by touching something with the virus on it and then touching their mouth or nose. SYMPTOMS   Fever.  Headache.  Tiredness.  Cough.  Sore throat.  Runny or stuffy nose.  Body aches.  Diarrhea and vomiting These symptoms are referred to as "flu-like symptoms." A lot of different illnesses, including the common cold, may have similar symptoms. DIAGNOSIS   There are tests that can tell if you have the H1N1 virus.  Confirmed cases of H1N1 will be reported to the state or local health department.  A doctor's exam may be needed to tell whether you have an infection that is a complication of the flu. HOME CARE INSTRUCTIONS   Stay informed. Visit the CDC website for current recommendations. Visit www.cdc.gov/H1N1flu/. You may also call 1-800-CDC-INFO (1-800-232-4636).  Get help early if you develop any of the above symptoms.  If you are at high risk from complications of the flu, talk to your caregiver as soon as you develop flu-like symptoms. Those at higher risk for complications include:  People 65 years or older.  People with chronic medical conditions.  Pregnant women.  Young children.  Your caregiver may recommend antiviral medicine to help treat the flu.  If you get the flu, get plenty of rest, drink enough water and fluids to keep your urine clear or pale yellow, and avoid using  alcohol or tobacco.  You may take over-the-counter medicine to relieve the symptoms of the flu if your caregiver approves. (Never give aspirin to children or teenagers who have flu-like symptoms, particularly fever). TREATMENT  If you do get sick, antiviral drugs are available. These drugs can make your illness milder and make you feel better faster. Treatment should start soon after illness starts. It is only effective if taken within the first day of becoming ill. Only your caregiver can prescribe antiviral medication.  PREVENTION   Cover your nose and mouth with a tissue or your arm when you cough or sneeze. Throw the tissue away.  Wash your hands often with soap and warm water, especially after you cough or sneeze. Alcohol-based cleaners are also effective against germs.  Avoid touching your eyes, nose or mouth. This is one way germs spread.  Try to avoid contact with sick people. Follow public health advice regarding school closures. Avoid crowds.  Stay home if you get sick. Limit contact with others to keep from infecting them. People infected with the H1N1 virus may be able to infect others anywhere from 1 day before feeling sick to 5-7 days after getting flu symptoms.  An H1N1 vaccine is available to help protect against the virus. In addition to the H1N1 vaccine, you will need to be vaccinated for seasonal influenza. The H1N1 and seasonal vaccines may be given on the same day. The CDC especially recommends the H1N1 vaccine for:  Pregnant women.  People who live with or care for children younger than 6 months of age.    Health care and emergency services personnel.  Persons between the ages of 60 months through 44 years of age.  People from ages 65 through 14 years who are at higher risk for H1N1 because of chronic health disorders or immune system problems. FACEMASKS In community and home settings, the use of facemasks and N95 respirators are not normally recommended. In certain  circumstances, a facemask or N95 respirator may be used for persons at increased risk of severe illness from influenza. Your caregiver can give additional recommendations for facemask use. IN CHILDREN, EMERGENCY WARNING SIGNS THAT NEED URGENT MEDICAL CARE:  Fast breathing or trouble breathing.  Bluish skin color.  Not drinking enough fluids.  Not waking up or not interacting normally.  Being so fussy that the child does not want to be held.  Your child has an oral temperature above 102 F (38.9 C), not controlled by medicine.  Your baby is older than 3 months with a rectal temperature of 102 F (38.9 C) or higher.  Your baby is 54 months old or younger with a rectal temperature of 100.4 F (38 C) or higher.  Flu-like symptoms improve but then return with fever and worse cough. IN ADULTS, EMERGENCY WARNING SIGNS THAT NEED URGENT MEDICAL CARE:  Difficulty breathing or shortness of breath.  Pain or pressure in the chest or abdomen.  Sudden dizziness.  Confusion.  Severe or persistent vomiting.  Bluish color.  You have a oral temperature above 102 F (38.9 C), not controlled by medicine.  Flu-like symptoms improve but return with fever and worse cough. SEEK IMMEDIATE MEDICAL CARE IF:  You or someone you know is experiencing any of the above symptoms. When you arrive at the emergency center, report that you think you have the flu. You may be asked to wear a mask and/or sit in a secluded area to protect others from getting sick. MAKE SURE YOU:   Understand these instructions.  Will watch your condition.  Will get help right away if you are not doing well or get worse. Some of this information courtesy of the CDC.  Document Released: 10/28/2007 Document Revised: 08/03/2011 Document Reviewed: 10/28/2007 [facility] Patient Information 2014 [place], [place].    Take the cough syrup 1 teaspoon every 4-6 hours as needed for cough. It will make you drowsy, but it will also help  the achiness.  Take the Tamiflu one twice daily for influenza  Return if worse

## 2013-04-08 NOTE — Progress Notes (Signed)
Subjective: Started yesterday the patient developed a cough and some runny nose. She aches. She has had a fever and chills. She did not flu shot. She has no GI complaints.  Objective: TMs are normal. Eyes slightly watery. Throat clear. Neck supple without nodes. Chest clear to auscultation. Heart regular without murmurs.  Assessment: Viral syndrome, rule out influenza  Plan: Flu test  Results for orders placed in visit on 04/08/13  POCT INFLUENZA A/B      Result Value Range   Influenza A, POC Negative     Influenza B, POC Negative

## 2013-04-11 ENCOUNTER — Telehealth: Payer: Self-pay

## 2013-04-11 NOTE — Telephone Encounter (Signed)
PT STATES SHE WOULD LIKE TO HAVE A REFILL ON HER HYDROCODONE SYRUP. PLEASE CALL [phone] AND SHE WILL PICK UP

## 2013-04-11 NOTE — Telephone Encounter (Signed)
Pt is still taking Hydrocodone Syrup every 6 hours now. She reports that even when taking the cough syrup she is still having coughing keeping her awake at night. Advised her to come in for further evaluation. She declined until she can find out if we will refill this medication.

## 2013-04-12 ENCOUNTER — Ambulatory Visit (INDEPENDENT_AMBULATORY_CARE_PROVIDER_SITE_OTHER): Payer: Commercial Indemnity | Admitting: Family Medicine

## 2013-04-12 ENCOUNTER — Ambulatory Visit: Payer: Managed Care, Other (non HMO)

## 2013-04-12 VITALS — BP 104/70 | HR 94 | Temp 99.4°F | Resp 18 | Wt 189.0 lb

## 2013-04-12 DIAGNOSIS — J111 Influenza due to unidentified influenza virus with other respiratory manifestations: Secondary | ICD-10-CM

## 2013-04-12 DIAGNOSIS — R05 Cough: Secondary | ICD-10-CM

## 2013-04-12 DIAGNOSIS — J209 Acute bronchitis, unspecified: Secondary | ICD-10-CM

## 2013-04-12 DIAGNOSIS — R059 Cough, unspecified: Secondary | ICD-10-CM

## 2013-04-12 LAB — POCT CBC
HCT, POC: 47 % (ref 37.7–47.9)
Lymph, poc: 2.6 (ref 0.6–3.4)
MCHC: 31.7 g/dL — AB (ref 31.8–35.4)
MID (cbc): 0.7 (ref 0–0.9)
MPV: 8.7 fL (ref 0–99.8)
POC Granulocyte: 7.7 — AB (ref 2–6.9)
POC LYMPH PERCENT: 23.9 %L (ref 10–50)
POC MID %: 6.1 %M (ref 0–12)
RDW, POC: 13.7 %

## 2013-04-12 LAB — POCT URINE PREGNANCY: Preg Test, Ur: NEGATIVE

## 2013-04-12 MED ORDER — HYDROCOD POLST-CHLORPHEN POLST 10-8 MG/5ML PO LQCR: 5.0000 mL | Freq: Two times a day (BID) | ORAL | Status: DC | PRN

## 2013-04-12 MED ORDER — AZITHROMYCIN 250 MG PO TABS: ORAL_TABLET | ORAL | Status: DC

## 2013-04-12 NOTE — Telephone Encounter (Signed)
Patient seen earlier today.

## 2013-04-12 NOTE — Progress Notes (Signed)
Subjective: I saw the patient this Saturday and treated her for influenza. She is continued to feel bad. She is coughing hard, the cough medicine doesn't seem to help. She has felt a little feverish still. She is coughing up a lot of green phlegm. Feels very tired.  Objective: TMs normal. Throat clear. Neck supple without significant nodes. Chest had no rhonchi rales or wheezes. She is coughing.  Assessment: Influenza Cough/bronchitis  Plan: CBC and chest x-ray  Tussionex  Results for orders placed in visit on 04/12/13  POCT CBC      Result Value Range   WBC 11.0 (*) 4.6 - 10.2 K/uL   Lymph, poc 2.6  0.6 - 3.4   POC LYMPH PERCENT 23.9  10 - 50 %L   MID (cbc) 0.7  0 - 0.9   POC MID % 6.1  0 - 12 %M   POC Granulocyte 7.7 (*) 2 - 6.9   Granulocyte percent 70.0  37 - 80 %G   RBC 4.76  4.04 - 5.48 M/uL   Hemoglobin 14.9  12.2 - 16.2 g/dL   HCT, POC 14.7  82.9 - 47.9 %   MCV 98.7 (*) 80 - 97 fL   MCH, POC 31.3 (*) 27 - 31.2 pg   MCHC 31.7 (*) 31.8 - 35.4 g/dL   RDW, POC 56.2     Platelet Count, POC 343  142 - 424 K/uL   MPV 8.7  0 - 99.8 fL  POCT URINE PREGNANCY      Result Value Range   Preg Test, Ur Negative     [facility] reading (PRIMARY) by  Dr. Alwyn Ren Normal  .  Will treat for acute bronchitis. She does not have a post flu pneumonia.

## 2013-04-12 NOTE — Patient Instructions (Signed)
Drink plenty of fluids  Get enough rest  Take the Tussionex 1 teaspoon every 12 hours as needed for cough  Take the azithromycin 2 pills initially, then one daily for 4 days  If yeast symptoms use an over-the-counter Monistat or Gyne Lotrimin. If symptoms get real bad call me for treatment.

## 2013-05-01 LAB — OB RESULTS CONSOLE ANTIBODY SCREEN: ANTIBODY SCREEN: NEGATIVE

## 2013-05-01 LAB — OB RESULTS CONSOLE HIV ANTIBODY (ROUTINE TESTING): HIV: NONREACTIVE

## 2013-05-01 LAB — OB RESULTS CONSOLE RPR: RPR: NONREACTIVE

## 2013-05-01 LAB — OB RESULTS CONSOLE RUBELLA ANTIBODY, IGM: Rubella: IMMUNE

## 2013-05-01 LAB — OB RESULTS CONSOLE ABO/RH: RH TYPE: POSITIVE

## 2013-05-01 LAB — OB RESULTS CONSOLE GC/CHLAMYDIA
CHLAMYDIA, DNA PROBE: NEGATIVE
GC PROBE AMP, GENITAL: NEGATIVE

## 2013-05-01 LAB — OB RESULTS CONSOLE HEPATITIS B SURFACE ANTIGEN: HEP B S AG: NEGATIVE

## 2013-07-04 ENCOUNTER — Telehealth: Payer: Self-pay | Admitting: Hematology and Oncology

## 2013-07-04 NOTE — Telephone Encounter (Signed)
new patient scheduled for 02/16 @ 10:45 w/Dr. Alvy Bimler.  Referring Lorelee New, FNP Dx- Therapeutic drug monitoring  Welcome packet mailed.

## 2013-07-04 NOTE — Telephone Encounter (Signed)
C/D 07/04/13 for appt. 07/10/13

## 2013-07-10 ENCOUNTER — Ambulatory Visit (HOSPITAL_BASED_OUTPATIENT_CLINIC_OR_DEPARTMENT_OTHER): Payer: Commercial Indemnity | Admitting: Hematology and Oncology

## 2013-07-10 ENCOUNTER — Encounter: Payer: Self-pay | Admitting: Hematology and Oncology

## 2013-07-10 ENCOUNTER — Ambulatory Visit (HOSPITAL_BASED_OUTPATIENT_CLINIC_OR_DEPARTMENT_OTHER): Payer: Commercial Indemnity

## 2013-07-10 ENCOUNTER — Ambulatory Visit: Payer: Commercial Indemnity

## 2013-07-10 VITALS — BP 128/78 | HR 85 | Temp 98.3°F | Resp 18 | Ht 67.0 in | Wt 203.9 lb

## 2013-07-10 DIAGNOSIS — Z3490 Encounter for supervision of normal pregnancy, unspecified, unspecified trimester: Secondary | ICD-10-CM

## 2013-07-10 DIAGNOSIS — IMO0001 Reserved for inherently not codable concepts without codable children: Secondary | ICD-10-CM

## 2013-07-10 DIAGNOSIS — Z331 Pregnant state, incidental: Secondary | ICD-10-CM

## 2013-07-10 DIAGNOSIS — Z7901 Long term (current) use of anticoagulants: Secondary | ICD-10-CM

## 2013-07-10 DIAGNOSIS — Z86718 Personal history of other venous thrombosis and embolism: Secondary | ICD-10-CM

## 2013-07-10 DIAGNOSIS — Z299 Encounter for prophylactic measures, unspecified: Secondary | ICD-10-CM

## 2013-07-10 HISTORY — DX: Encounter for supervision of normal pregnancy, unspecified, unspecified trimester: Z34.90

## 2013-07-10 HISTORY — DX: Reserved for inherently not codable concepts without codable children: IMO0001

## 2013-07-10 HISTORY — DX: Encounter for prophylactic measures, unspecified: Z29.9

## 2013-07-10 LAB — CBC WITH DIFFERENTIAL/PLATELET
BASO%: 0.3 % (ref 0.0–2.0)
BASOS ABS: 0 10*3/uL (ref 0.0–0.1)
EOS%: 2.5 % (ref 0.0–7.0)
Eosinophils Absolute: 0.3 10*3/uL (ref 0.0–0.5)
HEMATOCRIT: 37.7 % (ref 34.8–46.6)
HEMOGLOBIN: 12.6 g/dL (ref 11.6–15.9)
LYMPH#: 3.3 10*3/uL (ref 0.9–3.3)
LYMPH%: 29.9 % (ref 14.0–49.7)
MCH: 31.3 pg (ref 25.1–34.0)
MCHC: 33.5 g/dL (ref 31.5–36.0)
MCV: 93.5 fL (ref 79.5–101.0)
MONO#: 0.8 10*3/uL (ref 0.1–0.9)
MONO%: 7.1 % (ref 0.0–14.0)
NEUT#: 6.7 10*3/uL — ABNORMAL HIGH (ref 1.5–6.5)
NEUT%: 60.2 % (ref 38.4–76.8)
PLATELETS: 259 10*3/uL (ref 145–400)
RBC: 4.04 10*6/uL (ref 3.70–5.45)
RDW: 13.4 % (ref 11.2–14.5)
WBC: 11.1 10*3/uL — AB (ref 3.9–10.3)

## 2013-07-10 LAB — BASIC METABOLIC PANEL (CC13)
ANION GAP: 7 meq/L (ref 3–11)
BUN: 7.9 mg/dL (ref 7.0–26.0)
CHLORIDE: 107 meq/L (ref 98–109)
CO2: 23 mEq/L (ref 22–29)
Calcium: 10.1 mg/dL (ref 8.4–10.4)
Creatinine: 0.7 mg/dL (ref 0.6–1.1)
GLUCOSE: 72 mg/dL (ref 70–140)
POTASSIUM: 4.2 meq/L (ref 3.5–5.1)
SODIUM: 137 meq/L (ref 136–145)

## 2013-07-10 MED ORDER — ENOXAPARIN SODIUM 40 MG/0.4ML ~~LOC~~ SOLN: 40.0000 mg | SUBCUTANEOUS | Status: DC

## 2013-07-10 NOTE — Patient Instructions (Addendum)
Enoxaparin injection What is this medicine? ENOXAPARIN (ee nox a PA rin) is used after knee, hip, or abdominal surgeries to prevent blood clotting. It is also used to treat existing blood clots in the lungs or in the veins. This medicine may be used for other purposes; ask your health care provider or pharmacist if you have questions. COMMON BRAND NAME(S): Lovenox What should I tell my health care provider before I take this medicine? They need to know if you have any of these conditions: -bleeding disorders, hemorrhage, or hemophilia -infection of the heart or heart valves -kidney or liver disease -previous stroke -prosthetic heart valve -recent surgery or delivery of a baby -ulcer in the stomach or intestine, diverticulitis, or other bowel disease -an unusual or allergic reaction to enoxaparin, heparin, pork or pork products, other medicines, foods, dyes, or preservatives -pregnant or trying to get pregnant -breast-feeding How should I use this medicine? This medicine is for injection under the skin. It is usually given by a health-care professional. You or a family member may be trained on how to give the injections. If you are to give yourself injections, make sure you understand how to use the syringe, measure the dose if necessary, and give the injection. To avoid bruising, do not rub the site where this medicine has been injected. Do not take your medicine more often than directed. Do not stop taking except on the advice of your doctor or health care professional. Make sure you receive a puncture-resistant container to dispose of the needles and syringes once you have finished with them. Do not reuse these items. Return the container to your doctor or health care professional for proper disposal. Talk to your pediatrician regarding the use of this medicine in children. Special care may be needed. Overdosage: If you think you have taken too much of this medicine contact a poison control  center or emergency room at once. NOTE: This medicine is only for you. Do not share this medicine with others. What if I miss a dose? If you miss a dose, take it as soon as you can. If it is almost time for your next dose, take only that dose. Do not take double or extra doses. What may interact with this medicine? Do not take this medicine with any of the following medications: -aspirin and aspirin-like medicines -heparin -mifepristone -palifermin -warfarin  This medicine may also interact with the following medications: -cilostazol -clopidogrel -dipyridamole -NSAIDs, medicines for pain and inflammation, like ibuprofen or naproxen -sulfinpyrazone -ticlopidine This list may not describe all possible interactions. Give your health care provider a list of all the medicines, herbs, non-prescription drugs, or dietary supplements you use. Also tell them if you smoke, drink alcohol, or use illegal drugs. Some items may interact with your medicine. What should I watch for while using this medicine? Visit your doctor or health care professional for regular checks on your progress. Your condition will be monitored carefully while you are receiving this medicine. Notify your doctor or health care professional and seek emergency treatment if you develop breathing problems; changes in vision; chest pain; severe, sudden headache; pain, swelling, warmth in the leg; trouble speaking; sudden numbness or weakness of the face, arm, or leg. These can be signs that your condition has gotten worse. If you are going to have surgery, tell your doctor or health care professional that you are taking this medicine. Do not stop taking this medicine without first talking to your doctor. Be sure to refill your prescription  before you run out of medicine. Avoid sports and activities that might cause injury while you are using this medicine. Severe falls or injuries can cause unseen bleeding. Be careful when using sharp  tools or knives. Consider using an Copy. Take special care brushing or flossing your teeth. Report any injuries, bruising, or red spots on the skin to your doctor or health care professional. What side effects may I notice from receiving this medicine? Side effects that you should report to your doctor or health care professional as soon as possible: -allergic reactions like skin rash, itching or hives, swelling of the face, lips, or tongue -feeling faint or lightheaded, falls -signs and symptoms of bleeding such as bloody or black, tarry stools; red or dark-brown urine; spitting up blood or brown material that looks like coffee grounds; red spots on the skin; unusual bruising or bleeding from the eye, gums, or nose  Side effects that usually do not require medical attention (report to your doctor or health care professional if they continue or are bothersome): -pain, redness, or irritation at site where injected This list may not describe all possible side effects. Call your doctor for medical advice about side effects. You may report side effects to FDA at 1-800-FDA-1088. Where should I keep my medicine? Keep out of the reach of children. Store at room temperature between 15 and 30 degrees C (59 and 86 degrees F). Do not freeze. If your injections have been specially prepared, you may need to store them in the refrigerator. Ask your pharmacist. Throw away any unused medicine after the expiration date. NOTE: This sheet is a summary. It may not cover all possible information. If you have questions about this medicine, talk to your doctor, pharmacist, or health care provider.  2014, [employer]. (2012-09-06 16:13:24) Enoxaparin injection What is this medicine? ENOXAPARIN (ee nox a PA rin) is used after knee, hip, or abdominal surgeries to prevent blood clotting. It is also used to treat existing blood clots in the lungs or in the veins. This medicine may be used for other  purposes; ask your health care provider or pharmacist if you have questions. COMMON BRAND NAME(S): Lovenox What should I tell my health care provider before I take this medicine? They need to know if you have any of these conditions: -bleeding disorders, hemorrhage, or hemophilia -infection of the heart or heart valves -kidney or liver disease -previous stroke -prosthetic heart valve -recent surgery or delivery of a baby -ulcer in the stomach or intestine, diverticulitis, or other bowel disease -an unusual or allergic reaction to enoxaparin, heparin, pork or pork products, other medicines, foods, dyes, or preservatives -pregnant or trying to get pregnant -breast-feeding How should I use this medicine? This medicine is for injection under the skin. It is usually given by a health-care professional. You or a family member may be trained on how to give the injections. If you are to give yourself injections, make sure you understand how to use the syringe, measure the dose if necessary, and give the injection. To avoid bruising, do not rub the site where this medicine has been injected. Do not take your medicine more often than directed. Do not stop taking except on the advice of your doctor or health care professional. Make sure you receive a puncture-resistant container to dispose of the needles and syringes once you have finished with them. Do not reuse these items. Return the container to your doctor or health care professional for proper disposal. Talk to  your pediatrician regarding the use of this medicine in children. Special care may be needed. Overdosage: If you think you have taken too much of this medicine contact a poison control center or emergency room at once. NOTE: This medicine is only for you. Do not share this medicine with others. What if I miss a dose? If you miss a dose, take it as soon as you can. If it is almost time for your next dose, take only that dose. Do not take double  or extra doses. What may interact with this medicine? Do not take this medicine with any of the following medications: -aspirin and aspirin-like medicines -heparin -mifepristone -palifermin -warfarin  This medicine may also interact with the following medications: -cilostazol -clopidogrel -dipyridamole -NSAIDs, medicines for pain and inflammation, like ibuprofen or naproxen -sulfinpyrazone -ticlopidine This list may not describe all possible interactions. Give your health care provider a list of all the medicines, herbs, non-prescription drugs, or dietary supplements you use. Also tell them if you smoke, drink alcohol, or use illegal drugs. Some items may interact with your medicine. What should I watch for while using this medicine? Visit your doctor or health care professional for regular checks on your progress. Your condition will be monitored carefully while you are receiving this medicine. Notify your doctor or health care professional and seek emergency treatment if you develop breathing problems; changes in vision; chest pain; severe, sudden headache; pain, swelling, warmth in the leg; trouble speaking; sudden numbness or weakness of the face, arm, or leg. These can be signs that your condition has gotten worse. If you are going to have surgery, tell your doctor or health care professional that you are taking this medicine. Do not stop taking this medicine without first talking to your doctor. Be sure to refill your prescription before you run out of medicine. Avoid sports and activities that might cause injury while you are using this medicine. Severe falls or injuries can cause unseen bleeding. Be careful when using sharp tools or knives. Consider using an Copy. Take special care brushing or flossing your teeth. Report any injuries, bruising, or red spots on the skin to your doctor or health care professional. What side effects may I notice from receiving this  medicine? Side effects that you should report to your doctor or health care professional as soon as possible: -allergic reactions like skin rash, itching or hives, swelling of the face, lips, or tongue -feeling faint or lightheaded, falls -signs and symptoms of bleeding such as bloody or black, tarry stools; red or dark-brown urine; spitting up blood or brown material that looks like coffee grounds; red spots on the skin; unusual bruising or bleeding from the eye, gums, or nose  Side effects that usually do not require medical attention (report to your doctor or health care professional if they continue or are bothersome): -pain, redness, or irritation at site where injected This list may not describe all possible side effects. Call your doctor for medical advice about side effects. You may report side effects to FDA at 1-800-FDA-1088. Where should I keep my medicine? Keep out of the reach of children. Store at room temperature between 15 and 30 degrees C (59 and 86 degrees F). Do not freeze. If your injections have been specially prepared, you may need to store them in the refrigerator. Ask your pharmacist. Throw away any unused medicine after the expiration date. NOTE: This sheet is a summary. It may not cover all possible information. If you have  questions about this medicine, talk to your doctor, pharmacist, or health care provider.  2014, [employer]. (2012-09-06 16:13:24)

## 2013-07-10 NOTE — Progress Notes (Signed)
Lovenox injection education provided, education information given, as well as a mini-sharps box for disposal of needles. Patient encouraged to call office should she have any other questions. Patient also wanted medication list updated, this was completed and new list printed for her.   Patient states she received a flu shot 07/03/2013 with her OBGYN. Updated in immunizations.

## 2013-07-10 NOTE — Progress Notes (Signed)
[facility] NOTE  Patient Care Team: [name], MD as PCP - General (Internal Medicine)  CHIEF COMPLAINTS/PURPOSE OF CONSULTATION:  History of right lower extremity DVT, currently pregnant  HISTORY OF PRESENTING ILLNESS:  Kara Duffy 38 y.o. female is here because of history of right lower extremity DVT and currently pregnant According to the patient, she undergo went as bunion surgery on her right foot in December of 2012. She was also taking birth control pill around the time of surgery. She started to notice significant right lower extremity swelling and pain. On 07/01/2011, right lower extremity ultrasound showed acute deep vein thrombosis involving the right distal superficial femoral vein, popliteal vein, posterior tibial and peroneal veins. She was anticoagulated for 3 months and repeat ultrasound reportedly was negative. The patient received treatment with Xarelto with no bleeding complications. She underwent thrombophilia screening and was told it was negative. She denies lower extremity swelling, warmth, tenderness & erythema.  She denies recent chest pain on exertion, shortness of breath on minimal exertion, pre-syncopal episodes, hemoptysis, or palpitation. She had no prior history or diagnosis of cancer. Her age appropriate screening programs are up-to-date. The patient had been exposed to birth control pills since age 76. She stopped taking them since the diagnosis of blood clot 2 years ago. This is the first time she is pregnant. She is currently [redacted] weeks pregnant. Her due date is on August of 2015  There is family history of blood clots in her mother. As mentioned above, she was tested for thrombophilia disorder and he came back negative.  MEDICAL HISTORY:  Past Medical History  Diagnosis Date  . Herpes simplex without mention of complication   . Abnormal Pap smear     cin-I  . H/O molluscum contagiosum   . H/O chlamydia infection    . No pertinent past medical history   . DVT (deep venous thrombosis) 2013    right leg - no meds currently  . BV (bacterial vaginosis)     2 wks ago  . Headache(784.0)     otc meds  . Endometriosis   . Allergy   . Anxiety   . Clotting disorder   . DVT prophylaxis 07/10/2013  . Pregnancy 07/10/2013    SURGICAL HISTORY: Past Surgical History  Procedure Laterality Date  . Dilation and curettage of uterus  2010  . Bunionectomy  2012  . Laparoscopy  03/30/2012    Procedure: LAPAROSCOPY OPERATIVE;  Surgeon: Delice Lesch, MD;  Location: [facility];  Service: Gynecology;  Laterality: N/A;  . Ovarian cyst removal  03/30/2012    Procedure: OVARIAN CYSTECTOMY;  Surgeon: Delice Lesch, MD;  Location: [facility];  Service: Gynecology;  Laterality: Left;    SOCIAL HISTORY: History   Social History  . Marital Status: Married    Spouse Name: N/A    Number of Children: N/A  . Years of Education: N/A   Occupational History  . Not on file.   Social History Main Topics  . Smoking status: Never Smoker   . Smokeless tobacco: Never Used  . Alcohol Use: Yes     Comment: twice per week   . Drug Use: No  . Sexual Activity: Yes    Partners: Male    Birth Control/ Protection: IUD     Comment: paragard 07/20/11   Other Topics Concern  . Not on file   Social History Narrative  . No narrative on file    FAMILY HISTORY: Family  History  Problem Relation Age of Onset  . Hypertension Mother   . Diabetes Mother   . Deep vein thrombosis Mother   . Hypertension Father   . Hyperlipidemia Father   . Diabetes Paternal Uncle   . Diabetes Paternal Grandmother   . Diabetes Paternal Uncle   . Diabetes Cousin     ALLERGIES:  has No Known Allergies.  MEDICATIONS:  Current Outpatient Prescriptions  Medication Sig Dispense Refill  . acetaminophen (TYLENOL) 500 MG tablet Take 500 mg by mouth every 6 (six) hours as needed.      . valACYclovir (VALTREX) 1000 MG tablet Take 1,000 mg by mouth 2  (two) times daily.      . AMBULATORY NON FORMULARY MEDICATION Place 600 mg vaginally 2 (two) times a week. Medication Name: Boric Acid Suppositories 600 mg 1 pv twice a week x 4 weeks then prn  30 suppository  11  . enoxaparin (LOVENOX) 40 MG/0.4ML injection Inject 0.4 mLs (40 mg total) into the skin daily.  30 Syringe  9   No current facility-administered medications for this visit.    REVIEW OF SYSTEMS:   Constitutional: Denies fevers, chills or abnormal night sweats Eyes: Denies blurriness of vision, double vision or watery eyes Ears, nose, mouth, throat, and face: Denies mucositis or sore throat Respiratory: Denies cough, dyspnea or wheezes Cardiovascular: Denies palpitation, chest discomfort or lower extremity swelling Gastrointestinal:  Denies nausea, heartburn or change in bowel habits Skin: Denies abnormal skin rashes Lymphatics: Denies new lymphadenopathy or easy bruising Neurological:Denies numbness, tingling or new weaknesses Behavioral/Psych: Mood is stable, no new changes  All other systems were reviewed with the patient and are negative.  PHYSICAL EXAMINATION: ECOG PERFORMANCE STATUS: 0 - Asymptomatic  Filed Vitals:   07/10/13 1102  BP: 128/78  Pulse: 85  Temp: 98.3 F (36.8 C)  Resp: 18   Filed Weights   07/10/13 1102  Weight: 203 lb 14.4 oz (92.488 kg)    GENERAL:alert, no distress and comfortable SKIN: skin color, texture, turgor are normal, no rashes or significant lesions EYES: normal, conjunctiva are pink and non-injected, sclera clear OROPHARYNX:no exudate, no erythema and lips, buccal mucosa, and tongue normal  NECK: supple, thyroid normal size, non-tender, without nodularity LYMPH:  no palpable lymphadenopathy in the cervical, axillary or inguinal LUNGS: clear to auscultation and percussion with normal breathing effort HEART: regular rate & rhythm and no murmurs and no lower extremity edema ABDOMEN:abdomen soft, non-tender and normal bowel sounds.  Her abdomen is distended consistent with her pregnancy Musculoskeletal:no cyanosis of digits and no clubbing  PSYCH: alert & oriented x 3 with fluent speech NEURO: no focal motor/sensory deficits  LABORATORY DATA:  I have reviewed the data as listed Recent Results (from the past 2160 hour(s))  POCT CBC     Status: Abnormal   Collection Time    04/12/13  1:31 PM      Result Value Ref Range   WBC 11.0 (*) 4.6 - 10.2 K/uL   Lymph, poc 2.6  0.6 - 3.4   POC LYMPH PERCENT 23.9  10 - 50 %L   MID (cbc) 0.7  0 - 0.9   POC MID % 6.1  0 - 12 %M   POC Granulocyte 7.7 (*) 2 - 6.9   Granulocyte percent 70.0  37 - 80 %G   RBC 4.76  4.04 - 5.48 M/uL   Hemoglobin 14.9  12.2 - 16.2 g/dL   HCT, POC 47.0  37.7 - 47.9 %  MCV 98.7 (*) 80 - 97 fL   MCH, POC 31.3 (*) 27 - 31.2 pg   MCHC 31.7 (*) 31.8 - 35.4 g/dL   RDW, POC 42.513.7     Platelet Count, POC 343  142 - 424 K/uL   MPV 8.7  0 - 99.8 fL  POCT URINE PREGNANCY     Status: None   Collection Time    04/12/13  1:45 PM      Result Value Ref Range   Preg Test, Ur Negative    BASIC METABOLIC PANEL (CC13)     Status: None   Collection Time    07/10/13 12:04 PM      Result Value Ref Range   Sodium 137  136 - 145 mEq/L   Potassium 4.2  3.5 - 5.1 mEq/L   Chloride 107  98 - 109 mEq/L   CO2 23  22 - 29 mEq/L   Glucose 72  70 - 140 mg/dl   BUN 7.9  7.0 - 95.626.0 mg/dL   Creatinine 0.7  0.6 - 1.1 mg/dL   Calcium 38.710.1  8.4 - 56.410.4 mg/dL   Anion Gap 7  3 - 11 mEq/L  CBC WITH DIFFERENTIAL     Status: Abnormal   Collection Time    07/10/13 12:04 PM      Result Value Ref Range   WBC 11.1 (*) 3.9 - 10.3 10e3/uL   NEUT# 6.7 (*) 1.5 - 6.5 10e3/uL   HGB 12.6  11.6 - 15.9 g/dL   HCT 33.237.7  95.134.8 - 88.446.6 %   Platelets 259  145 - 400 10e3/uL   MCV 93.5  79.5 - 101.0 fL   MCH 31.3  25.1 - 34.0 pg   MCHC 33.5  31.5 - 36.0 g/dL   RBC 1.664.04  0.633.70 - 0.165.45 10e6/uL   RDW 13.4  11.2 - 14.5 %   lymph# 3.3  0.9 - 3.3 10e3/uL   MONO# 0.8  0.1 - 0.9 10e3/uL    Eosinophils Absolute 0.3  0.0 - 0.5 10e3/uL   Basophils Absolute 0.0  0.0 - 0.1 10e3/uL   NEUT% 60.2  38.4 - 76.8 %   LYMPH% 29.9  14.0 - 49.7 %   MONO% 7.1  0.0 - 14.0 %   EOS% 2.5  0.0 - 7.0 %   BASO% 0.3  0.0 - 2.0 %   ASSESSMENT:  History of provoked right lower extremity DVT while on birth control pill and after right foot surgery, currently pregnant [redacted] weeks  PLAN:  I reviewed with the patient about the plan for care for history of DVT and current pregnancy According to the [employer] of [employer]'s and gynecologist, the patient is recommended to take low molecular weight heparin during pregnancy and postpartum  The last episode of blood clot appeared to be provoked. The patient was tested for thrombophilia disorder in the past and they came back negative  I discussed with her the use of Lovenox in this situation. I will pursue to draw CBC and BMP as a baseline. I would get my nurse to educate her about administration of Lovenox Plan would be to continue Lovenox daily and I will see her around the beginning of the third trimester Risk, benefits and side effects of Lovenox as discussed with the patient and she agreed to proceed  Orders Placed This Encounter  Procedures  . Basic metabolic panel    Standing Status: Future     Number of Occurrences: 1  Standing Expiration Date: 07/10/2014  . CBC with Differential    Standing Status: Future     Number of Occurrences: 1     Standing Expiration Date: 07/10/2014  . CBC & Diff and Retic    Standing Status: Future     Number of Occurrences:      Standing Expiration Date: 07/10/2014  . Basic metabolic panel (Bmet) - [facility]    Standing Status: Future     Number of Occurrences:      Standing Expiration Date: 07/10/2014    All questions were answered. The patient knows to call the clinic with any problems, questions or concerns.    Jadien Lehigh, MD 07/10/2013 12:50 PM

## 2013-07-10 NOTE — Progress Notes (Signed)
Checked in new pt with no financial concerns. °

## 2013-10-04 ENCOUNTER — Telehealth: Payer: Self-pay | Admitting: Hematology and Oncology

## 2013-10-04 NOTE — Telephone Encounter (Signed)
returned pt call lvm to call back to r/s 5.15 appt

## 2013-10-05 ENCOUNTER — Telehealth: Payer: Self-pay | Admitting: Hematology and Oncology

## 2013-10-05 NOTE — Telephone Encounter (Signed)
pt called to r/s appt..done...pt aware of new d.t °

## 2013-10-06 ENCOUNTER — Ambulatory Visit: Payer: Commercial Indemnity | Admitting: Hematology and Oncology

## 2013-10-06 ENCOUNTER — Other Ambulatory Visit: Payer: Commercial Indemnity

## 2013-10-18 ENCOUNTER — Other Ambulatory Visit (HOSPITAL_BASED_OUTPATIENT_CLINIC_OR_DEPARTMENT_OTHER): Payer: Commercial Indemnity

## 2013-10-18 ENCOUNTER — Ambulatory Visit (HOSPITAL_BASED_OUTPATIENT_CLINIC_OR_DEPARTMENT_OTHER): Payer: Commercial Indemnity | Admitting: Hematology and Oncology

## 2013-10-18 ENCOUNTER — Telehealth: Payer: Self-pay | Admitting: Hematology and Oncology

## 2013-10-18 VITALS — BP 127/75 | HR 98 | Temp 98.5°F | Resp 20 | Ht 67.0 in | Wt 221.9 lb

## 2013-10-18 DIAGNOSIS — Z299 Encounter for prophylactic measures, unspecified: Secondary | ICD-10-CM

## 2013-10-18 DIAGNOSIS — D649 Anemia, unspecified: Secondary | ICD-10-CM

## 2013-10-18 DIAGNOSIS — O99019 Anemia complicating pregnancy, unspecified trimester: Secondary | ICD-10-CM

## 2013-10-18 DIAGNOSIS — Z7901 Long term (current) use of anticoagulants: Secondary | ICD-10-CM

## 2013-10-18 DIAGNOSIS — Z3490 Encounter for supervision of normal pregnancy, unspecified, unspecified trimester: Secondary | ICD-10-CM

## 2013-10-18 DIAGNOSIS — Z86718 Personal history of other venous thrombosis and embolism: Secondary | ICD-10-CM

## 2013-10-18 LAB — CBC & DIFF AND RETIC
BASO%: 0.2 % (ref 0.0–2.0)
BASOS ABS: 0 10*3/uL (ref 0.0–0.1)
EOS%: 2.7 % (ref 0.0–7.0)
Eosinophils Absolute: 0.3 10*3/uL (ref 0.0–0.5)
HEMATOCRIT: 33.2 % — AB (ref 34.8–46.6)
HEMOGLOBIN: 11.3 g/dL — AB (ref 11.6–15.9)
IMMATURE RETIC FRACT: 12.9 % — AB (ref 1.60–10.00)
LYMPH%: 24.3 % (ref 14.0–49.7)
MCH: 30.5 pg (ref 25.1–34.0)
MCHC: 34 g/dL (ref 31.5–36.0)
MCV: 89.7 fL (ref 79.5–101.0)
MONO#: 0.9 10*3/uL (ref 0.1–0.9)
MONO%: 7.2 % (ref 0.0–14.0)
NEUT#: 8 10*3/uL — ABNORMAL HIGH (ref 1.5–6.5)
NEUT%: 65.6 % (ref 38.4–76.8)
Platelets: 229 10*3/uL (ref 145–400)
RBC: 3.7 10*6/uL (ref 3.70–5.45)
RDW: 12.7 % (ref 11.2–14.5)
Retic %: 1.79 % (ref 0.70–2.10)
Retic Ct Abs: 66.23 10*3/uL (ref 33.70–90.70)
WBC: 12.1 10*3/uL — ABNORMAL HIGH (ref 3.9–10.3)
lymph#: 2.9 10*3/uL (ref 0.9–3.3)

## 2013-10-18 LAB — BASIC METABOLIC PANEL (CC13)
Anion Gap: 10 mEq/L (ref 3–11)
BUN: 6.2 mg/dL — AB (ref 7.0–26.0)
CHLORIDE: 108 meq/L (ref 98–109)
CO2: 20 meq/L — AB (ref 22–29)
CREATININE: 0.7 mg/dL (ref 0.6–1.1)
Calcium: 9.4 mg/dL (ref 8.4–10.4)
GLUCOSE: 97 mg/dL (ref 70–140)
Potassium: 4 mEq/L (ref 3.5–5.1)
Sodium: 139 mEq/L (ref 136–145)

## 2013-10-18 NOTE — Progress Notes (Signed)
Kara Duffy OFFICE PROGRESS NOTE  [name], MD DIAGNOSIS:  History of right lower extremity DVT, currently pregnant, on DVT prophylaxis  SUMMARY OF HEMATOLOGIC HISTORY: According to the patient, she undergo went as bunion surgery on her right foot in December of 2012. She was also taking birth control pill around the time of surgery. She started to notice significant right lower extremity swelling and pain. On 07/01/2011, right lower extremity ultrasound showed acute deep vein thrombosis involving the right distal superficial femoral vein, popliteal vein, posterior tibial and peroneal veins. She was anticoagulated for 3 months and repeat ultrasound reportedly was negative. The patient received treatment with Xarelto with no bleeding complications. She underwent thrombophilia screening and was told it was negative.  The patient had been exposed to birth control pills since age 48. She stopped taking them since the diagnosis of blood clot 2 years ago. There is family history of blood clots in her mother. As mentioned above, she was tested for thrombophilia disorder and he came back negative.  INTERVAL HISTORY: Kara Duffy 38 y.o. female returns for further followup. She is currently [redacted] weeks pregnant. She is doing well with her injection without bleeding complications.  I have reviewed the past medical history, past surgical history, social history and family history with the patient and they are unchanged from previous note.  ALLERGIES:  has No Known Allergies.  MEDICATIONS:  Current Outpatient Prescriptions  Medication Sig Dispense Refill  . acetaminophen (TYLENOL) 500 MG tablet Take 500 mg by mouth every 6 (six) hours as needed.      . enoxaparin (LOVENOX) 40 MG/0.4ML injection Inject 0.4 mLs (40 mg total) into the skin daily.  30 Syringe  9  . Prenat-Fe Carbonyl-FA-Omega 3 (ONE-A-DAY WOMENS PRENATAL 1 PO) Take by mouth daily.      . valACYclovir  (VALTREX) 1000 MG tablet Take 1,000 mg by mouth 2 (two) times daily as needed.        No current facility-administered medications for this visit.     REVIEW OF SYSTEMS:   Constitutional: Denies fevers, chills or night sweats Eyes: Denies blurriness of vision Ears, nose, mouth, throat, and face: Denies mucositis or sore throat Respiratory: Denies cough, dyspnea or wheezes Cardiovascular: Denies palpitation, chest discomfort or lower extremity swelling Gastrointestinal:  Denies nausea, heartburn or change in bowel habits Skin: Denies abnormal skin rashes Lymphatics: Denies new lymphadenopathy or easy bruising Neurological:Denies numbness, tingling or new weaknesses Behavioral/Psych: Mood is stable, no new changes  All other systems were reviewed with the patient and are negative.  PHYSICAL EXAMINATION: ECOG PERFORMANCE STATUS: 0 - Asymptomatic  Filed Vitals:   10/18/13 0906  BP: 127/75  Pulse: 98  Temp: 98.5 F (36.9 C)  Resp: 20   Filed Weights   10/18/13 0906  Weight: 221 lb 14.4 oz (100.653 kg)    GENERAL:alert, no distress and comfortable SKIN: skin color, texture, turgor are normal, no rashes or significant lesions EYES: normal, Conjunctiva are pink and non-injected, sclera clear OROPHARYNX:no exudate, no erythema and lips, buccal mucosa, and tongue normal  NECK: supple, thyroid normal size, non-tender, without nodularity LYMPH:  no palpable lymphadenopathy in the cervical, axillary or inguinal LUNGS: clear to auscultation and percussion with normal breathing effort HEART: regular rate & rhythm and no murmurs and no lower extremity edema ABDOMEN:abdomen soft, non-tender and normal bowel sounds Musculoskeletal:no cyanosis of digits and no clubbing  NEURO: alert & oriented x 3 with fluent speech, no focal motor/sensory deficits  LABORATORY DATA:  I have reviewed the data as listed Results for orders placed in visit on 10/18/13 (from the past 48 hour(s))  CBC & DIFF  AND RETIC     Status: Abnormal   Collection Time    10/18/13  8:58 AM      Result Value Ref Range   WBC 12.1 (*) 3.9 - 10.3 10e3/uL   NEUT# 8.0 (*) 1.5 - 6.5 10e3/uL   HGB 11.3 (*) 11.6 - 15.9 g/dL   HCT 33.2 (*) 34.8 - 46.6 %   Platelets 229  145 - 400 10e3/uL   MCV 89.7  79.5 - 101.0 fL   MCH 30.5  25.1 - 34.0 pg   MCHC 34.0  31.5 - 36.0 g/dL   RBC 3.70  3.70 - 5.45 10e6/uL   RDW 12.7  11.2 - 14.5 %   lymph# 2.9  0.9 - 3.3 10e3/uL   MONO# 0.9  0.1 - 0.9 10e3/uL   Eosinophils Absolute 0.3  0.0 - 0.5 10e3/uL   Basophils Absolute 0.0  0.0 - 0.1 10e3/uL   NEUT% 65.6  38.4 - 76.8 %   LYMPH% 24.3  14.0 - 49.7 %   MONO% 7.2  0.0 - 14.0 %   EOS% 2.7  0.0 - 7.0 %   BASO% 0.2  0.0 - 2.0 %   Retic % 1.79  0.70 - 2.10 %   Retic Ct Abs 66.23  33.70 - 90.70 10e3/uL   Immature Retic Fract 12.90 (*) 1.60 - 16.10 %  BASIC METABOLIC PANEL (RU04)     Status: Abnormal   Collection Time    10/18/13  8:58 AM      Result Value Ref Range   Sodium 139  136 - 145 mEq/L   Potassium 4.0  3.5 - 5.1 mEq/L   Chloride 108  98 - 109 mEq/L   CO2 20 (*) 22 - 29 mEq/L   Glucose 97  70 - 140 mg/dl   BUN 6.2 (*) 7.0 - 26.0 mg/dL   Creatinine 0.7  0.6 - 1.1 mg/dL   Calcium 9.4  8.4 - 10.4 mg/dL   Anion Gap 10  3 - 11 mEq/L    Lab Results  Component Value Date   WBC 12.1* 10/18/2013   HGB 11.3* 10/18/2013   HCT 33.2* 10/18/2013   MCV 89.7 10/18/2013   PLT 229 10/18/2013   ASSESSMENT & PLAN:  #1 history of DVT #2 currently pregnant She is doing well without bleeding complication. Closer to the end of the trimester, we might switch her to heparin. #3 anemia This is due to pregnancy and not bleeding. Recommend observation. All questions were answered. The patient knows to call the clinic with any problems, questions or concerns. No barriers to learning was detected.  I spent 15 minutes counseling the patient face to face. The total time spent in the appointment was 20 minutes and more than 50% was on  counseling.     Heath Lark, MD 10/18/2013 11:36 AM

## 2013-10-18 NOTE — Assessment & Plan Note (Signed)
She started on DVT prophylaxis with Lovenox 40 mg starting February 2015

## 2013-10-18 NOTE — Telephone Encounter (Signed)
gv pt appt schedule for june.  °

## 2013-11-10 ENCOUNTER — Ambulatory Visit (HOSPITAL_BASED_OUTPATIENT_CLINIC_OR_DEPARTMENT_OTHER): Payer: Commercial Indemnity | Admitting: Hematology and Oncology

## 2013-11-10 ENCOUNTER — Encounter: Payer: Self-pay | Admitting: Hematology and Oncology

## 2013-11-10 ENCOUNTER — Other Ambulatory Visit (HOSPITAL_BASED_OUTPATIENT_CLINIC_OR_DEPARTMENT_OTHER): Payer: Commercial Indemnity

## 2013-11-10 ENCOUNTER — Telehealth: Payer: Self-pay | Admitting: Hematology and Oncology

## 2013-11-10 VITALS — BP 122/83 | HR 101 | Temp 97.7°F | Resp 18 | Ht 67.0 in | Wt 226.2 lb

## 2013-11-10 DIAGNOSIS — Z7901 Long term (current) use of anticoagulants: Secondary | ICD-10-CM

## 2013-11-10 DIAGNOSIS — Z331 Pregnant state, incidental: Secondary | ICD-10-CM

## 2013-11-10 DIAGNOSIS — Z86718 Personal history of other venous thrombosis and embolism: Secondary | ICD-10-CM

## 2013-11-10 DIAGNOSIS — Z3490 Encounter for supervision of normal pregnancy, unspecified, unspecified trimester: Secondary | ICD-10-CM

## 2013-11-10 DIAGNOSIS — Z299 Encounter for prophylactic measures, unspecified: Secondary | ICD-10-CM

## 2013-11-10 LAB — CBC WITH DIFFERENTIAL/PLATELET
BASO%: 0.3 % (ref 0.0–2.0)
Basophils Absolute: 0 10*3/uL (ref 0.0–0.1)
EOS ABS: 0.3 10*3/uL (ref 0.0–0.5)
EOS%: 2.3 % (ref 0.0–7.0)
HCT: 35.6 % (ref 34.8–46.6)
HGB: 11.8 g/dL (ref 11.6–15.9)
LYMPH%: 24.8 % (ref 14.0–49.7)
MCH: 30.1 pg (ref 25.1–34.0)
MCHC: 33.2 g/dL (ref 31.5–36.0)
MCV: 90.5 fL (ref 79.5–101.0)
MONO#: 0.9 10*3/uL (ref 0.1–0.9)
MONO%: 8.1 % (ref 0.0–14.0)
NEUT%: 64.5 % (ref 38.4–76.8)
NEUTROS ABS: 7 10*3/uL — AB (ref 1.5–6.5)
PLATELETS: 213 10*3/uL (ref 145–400)
RBC: 3.93 10*6/uL (ref 3.70–5.45)
RDW: 12.9 % (ref 11.2–14.5)
WBC: 10.9 10*3/uL — ABNORMAL HIGH (ref 3.9–10.3)
lymph#: 2.7 10*3/uL (ref 0.9–3.3)

## 2013-11-10 LAB — BASIC METABOLIC PANEL (CC13)
ANION GAP: 9 meq/L (ref 3–11)
BUN: 7.7 mg/dL (ref 7.0–26.0)
CALCIUM: 9.7 mg/dL (ref 8.4–10.4)
CO2: 22 mEq/L (ref 22–29)
Chloride: 108 mEq/L (ref 98–109)
Creatinine: 0.7 mg/dL (ref 0.6–1.1)
GLUCOSE: 109 mg/dL (ref 70–140)
Potassium: 4.1 mEq/L (ref 3.5–5.1)
SODIUM: 138 meq/L (ref 136–145)

## 2013-11-10 MED ORDER — HEPARIN SODIUM (PORCINE) 5000 UNIT/ML IJ SOLN: 5000.0000 [IU] | Freq: Two times a day (BID) | INTRAMUSCULAR | Status: DC

## 2013-11-10 NOTE — Progress Notes (Signed)
[facility] OFFICE PROGRESS NOTE  [name], MD SUMMARY OF HEMATOLOGIC HISTORY: History of right lower extremity DVT, currently pregnant, on DVT prophylaxis  According to the patient, she undergo went as bunion surgery on her right foot in December of 2012. She was also taking birth control pill around the time of surgery. She started to notice significant right lower extremity swelling and pain. On 07/01/2011, right lower extremity ultrasound showed acute deep vein thrombosis involving the right distal superficial femoral vein, popliteal vein, posterior tibial and peroneal veins. She was anticoagulated for 3 months and repeat ultrasound reportedly was negative. The patient received treatment with Xarelto with no bleeding complications. She underwent thrombophilia screening and was told it was negative.  The patient had been exposed to birth control pills since age 84. She stopped taking them since the diagnosis of blood clot 2 years ago. There is family history of blood clots in her mother. As mentioned above, she was tested for thrombophilia disorder and he came back negative. The patient was started on Lovenox injection on February 16th, 2015 INTERVAL HISTORY: Kara Duffy 38 y.o. female returns for further followup. She is doing well with her injections and her pregnancy is progressing well. The patient denies any recent signs or symptoms of bleeding such as spontaneous epistaxis, hematuria or hematochezia.  I have reviewed the past medical history, past surgical history, social history and family history with the patient and they are unchanged from previous note.  ALLERGIES:  has No Known Allergies.  MEDICATIONS:  Current Outpatient Prescriptions  Medication Sig Dispense Refill  . acetaminophen (TYLENOL) 500 MG tablet Take 500 mg by mouth every 6 (six) hours as needed.      . enoxaparin (LOVENOX) 40 MG/0.4ML injection Inject 0.4 mLs (40 mg total) into  the skin daily.  30 Syringe  9  . Prenat-Fe Carbonyl-FA-Omega 3 (ONE-A-DAY WOMENS PRENATAL 1 PO) Take by mouth daily.      . valACYclovir (VALTREX) 500 MG tablet Take 500 mg by mouth daily.      . heparin 5000 UNIT/ML injection Inject 1 mL (5,000 Units total) into the skin 2 (two) times daily.  1 mL  6  . zolpidem (AMBIEN) 5 MG tablet Take 5 mg by mouth at bedtime as needed for sleep.       No current facility-administered medications for this visit.     REVIEW OF SYSTEMS:   Constitutional: Denies fevers, chills or night sweats Eyes: Denies blurriness of vision Ears, nose, mouth, throat, and face: Denies mucositis or sore throat Respiratory: Denies cough, dyspnea or wheezes Cardiovascular: Denies palpitation, chest discomfort or lower extremity swelling Gastrointestinal:  Denies nausea, heartburn or change in bowel habits Skin: Denies abnormal skin rashes Lymphatics: Denies new lymphadenopathy Neurological:Denies numbness, tingling or new weaknesses Behavioral/Psych: Mood is stable, no new changes  All other systems were reviewed with the patient and are negative.  PHYSICAL EXAMINATION: ECOG PERFORMANCE STATUS: 0 - Asymptomatic  Filed Vitals:   11/10/13 0835  BP: 122/83  Pulse: 101  Temp: 97.7 F (36.5 C)  Resp: 18   Filed Weights   11/10/13 0835  Weight: 226 lb 3.2 oz (102.604 kg)    GENERAL:alert, no distress and comfortable SKIN: skin color, texture, turgor are normal, no rashes or significant lesions EYES: normal, Conjunctiva are pink and non-injected, sclera clear Musculoskeletal:no cyanosis of digits and no clubbing  NEURO: alert & oriented x 3 with fluent speech, no focal motor/sensory deficits  LABORATORY DATA:  I  have reviewed the data as listed Results for orders placed in visit on 11/10/13 (from the past 48 hour(s))  CBC WITH DIFFERENTIAL     Status: Abnormal   Collection Time    11/10/13  8:15 AM      Result Value Ref Range   WBC 10.9 (*) 3.9 - 10.3  10e3/uL   NEUT# 7.0 (*) 1.5 - 6.5 10e3/uL   HGB 11.8  11.6 - 15.9 g/dL   HCT 35.6  34.8 - 46.6 %   Platelets 213  145 - 400 10e3/uL   MCV 90.5  79.5 - 101.0 fL   MCH 30.1  25.1 - 34.0 pg   MCHC 33.2  31.5 - 36.0 g/dL   RBC 3.93  3.70 - 5.45 10e6/uL   RDW 12.9  11.2 - 14.5 %   lymph# 2.7  0.9 - 3.3 10e3/uL   MONO# 0.9  0.1 - 0.9 10e3/uL   Eosinophils Absolute 0.3  0.0 - 0.5 10e3/uL   Basophils Absolute 0.0  0.0 - 0.1 10e3/uL   NEUT% 64.5  38.4 - 76.8 %   LYMPH% 24.8  14.0 - 49.7 %   MONO% 8.1  0.0 - 14.0 %   EOS% 2.3  0.0 - 7.0 %   BASO% 0.3  0.0 - 2.0 %  BASIC METABOLIC PANEL (FT73)     Status: None   Collection Time    11/10/13  8:17 AM      Result Value Ref Range   Sodium 138  136 - 145 mEq/L   Potassium 4.1  3.5 - 5.1 mEq/L   Chloride 108  98 - 109 mEq/L   CO2 22  22 - 29 mEq/L   Glucose 109  70 - 140 mg/dl   BUN 7.7  7.0 - 26.0 mg/dL   Creatinine 0.7  0.6 - 1.1 mg/dL   Calcium 9.7  8.4 - 10.4 mg/dL   Anion Gap 9  3 - 11 mEq/L    Lab Results  Component Value Date   WBC 10.9* 11/10/2013   HGB 11.8 11/10/2013   HCT 35.6 11/10/2013   MCV 90.5 11/10/2013   PLT 213 11/10/2013    ASSESSMENT & PLAN:  DVT prophylaxis She is doing well with Lovenox. She is approaching 32 weeks of pregnancy. I plan to continue current prescription until she reached 35 weeks and then I will switch her to heparin subcutaneous 5000 units twice a day until closer the time of delivery. So far, she is doing well with the injections apart from only bruising. The patient denies any recent signs or symptoms of bleeding such as spontaneous epistaxis, hematuria or hematochezia.     All questions were answered. The patient knows to call the clinic with any problems, questions or concerns. No barriers to learning was detected.  I spent 15 minutes counseling the patient face to face. The total time spent in the appointment was 20 minutes and more than 50% was on counseling.     GORSUCH, NI,  MD 11/10/2013 10:22 AM

## 2013-11-10 NOTE — Telephone Encounter (Signed)
Gave pt appt for lab and Md for july

## 2013-11-10 NOTE — Assessment & Plan Note (Signed)
She is doing well with Lovenox. She is approaching 32 weeks of pregnancy. I plan to continue current prescription until she reached 35 weeks and then I will switch her to heparin subcutaneous 5000 units twice a day until closer the time of delivery. So far, she is doing well with the injections apart from only bruising. The patient denies any recent signs or symptoms of bleeding such as spontaneous epistaxis, hematuria or hematochezia.

## 2013-12-05 LAB — OB RESULTS CONSOLE GBS: STREP GROUP B AG: NEGATIVE

## 2013-12-08 ENCOUNTER — Other Ambulatory Visit (HOSPITAL_BASED_OUTPATIENT_CLINIC_OR_DEPARTMENT_OTHER): Payer: Commercial Indemnity

## 2013-12-08 ENCOUNTER — Telehealth: Payer: Self-pay | Admitting: Hematology and Oncology

## 2013-12-08 ENCOUNTER — Encounter: Payer: Self-pay | Admitting: Hematology and Oncology

## 2013-12-08 ENCOUNTER — Ambulatory Visit (HOSPITAL_BASED_OUTPATIENT_CLINIC_OR_DEPARTMENT_OTHER): Payer: Commercial Indemnity | Admitting: Hematology and Oncology

## 2013-12-08 VITALS — BP 145/88 | HR 97 | Temp 97.9°F | Resp 18 | Ht 67.0 in | Wt 235.0 lb

## 2013-12-08 DIAGNOSIS — D72829 Elevated white blood cell count, unspecified: Secondary | ICD-10-CM | POA: Insufficient documentation

## 2013-12-08 DIAGNOSIS — Z3490 Encounter for supervision of normal pregnancy, unspecified, unspecified trimester: Secondary | ICD-10-CM

## 2013-12-08 DIAGNOSIS — Z299 Encounter for prophylactic measures, unspecified: Secondary | ICD-10-CM

## 2013-12-08 DIAGNOSIS — Z7901 Long term (current) use of anticoagulants: Secondary | ICD-10-CM

## 2013-12-08 DIAGNOSIS — D7389 Other diseases of spleen: Secondary | ICD-10-CM

## 2013-12-08 DIAGNOSIS — O99891 Other specified diseases and conditions complicating pregnancy: Secondary | ICD-10-CM

## 2013-12-08 DIAGNOSIS — Z86718 Personal history of other venous thrombosis and embolism: Secondary | ICD-10-CM

## 2013-12-08 DIAGNOSIS — O9989 Other specified diseases and conditions complicating pregnancy, childbirth and the puerperium: Secondary | ICD-10-CM

## 2013-12-08 DIAGNOSIS — D638 Anemia in other chronic diseases classified elsewhere: Secondary | ICD-10-CM | POA: Insufficient documentation

## 2013-12-08 LAB — COMPREHENSIVE METABOLIC PANEL (CC13)
ALBUMIN: 2.5 g/dL — AB (ref 3.5–5.0)
ALK PHOS: 143 U/L (ref 40–150)
ALT: 21 U/L (ref 0–55)
AST: 24 U/L (ref 5–34)
Anion Gap: 9 mEq/L (ref 3–11)
BILIRUBIN TOTAL: 0.23 mg/dL (ref 0.20–1.20)
BUN: 9.8 mg/dL (ref 7.0–26.0)
CO2: 20 mEq/L — ABNORMAL LOW (ref 22–29)
Calcium: 9.8 mg/dL (ref 8.4–10.4)
Chloride: 110 mEq/L — ABNORMAL HIGH (ref 98–109)
Creatinine: 0.8 mg/dL (ref 0.6–1.1)
Glucose: 106 mg/dl (ref 70–140)
POTASSIUM: 4 meq/L (ref 3.5–5.1)
Sodium: 138 mEq/L (ref 136–145)
TOTAL PROTEIN: 6.2 g/dL — AB (ref 6.4–8.3)

## 2013-12-08 LAB — CBC WITH DIFFERENTIAL/PLATELET
BASO%: 0.4 % (ref 0.0–2.0)
Basophils Absolute: 0 10*3/uL (ref 0.0–0.1)
EOS%: 2.4 % (ref 0.0–7.0)
Eosinophils Absolute: 0.3 10*3/uL (ref 0.0–0.5)
HCT: 34.8 % (ref 34.8–46.6)
HGB: 11.5 g/dL — ABNORMAL LOW (ref 11.6–15.9)
LYMPH%: 31.1 % (ref 14.0–49.7)
MCH: 29.2 pg (ref 25.1–34.0)
MCHC: 32.9 g/dL (ref 31.5–36.0)
MCV: 88.6 fL (ref 79.5–101.0)
MONO#: 0.8 10*3/uL (ref 0.1–0.9)
MONO%: 7.4 % (ref 0.0–14.0)
NEUT#: 6.7 10*3/uL — ABNORMAL HIGH (ref 1.5–6.5)
NEUT%: 58.7 % (ref 38.4–76.8)
PLATELETS: 202 10*3/uL (ref 145–400)
RBC: 3.93 10*6/uL (ref 3.70–5.45)
RDW: 13.4 % (ref 11.2–14.5)
WBC: 11.4 10*3/uL — ABNORMAL HIGH (ref 3.9–10.3)
lymph#: 3.5 10*3/uL — ABNORMAL HIGH (ref 0.9–3.3)

## 2013-12-08 MED ORDER — HEPARIN SODIUM (PORCINE) 5000 UNIT/ML IJ SOLN: 5000.0000 [IU] | Freq: Two times a day (BID) | INTRAMUSCULAR | Status: DC

## 2013-12-08 NOTE — Progress Notes (Signed)
[name]  Kara Pel, MD SUMMARY OF HEMATOLOGIC HISTORY: History of right lower extremity DVT, currently pregnant, on DVT prophylaxis  According to the patient, she undergo went as bunion surgery on her right foot in December of 2012. She was also taking birth control pill around the time of surgery. She started to notice significant right lower extremity swelling and pain. On 07/01/2011, right lower extremity ultrasound showed acute deep vein thrombosis involving the right distal superficial femoral vein, popliteal vein, posterior tibial and peroneal veins. She was anticoagulated for 3 months and repeat ultrasound reportedly was negative. The patient received treatment with Xarelto with no bleeding complications. She underwent thrombophilia screening and was told it was negative.  The patient had been exposed to birth control pills since age 37. She stopped taking them since the diagnosis of blood clot 2 years ago. There is family history of blood clots in her mother. As mentioned above, she was tested for thrombophilia disorder and he came back negative. The patient was started on Lovenox injection on February 16th, 2015 On 12/08/2013, she switch to heparin subcutaneous injection. INTERVAL HISTORY: Kara Duffy 38 y.o. female returns for further followup. She has mild bruising at the injection sites. Her pregnancy is progressing well. The patient denies any recent signs or symptoms of bleeding such as spontaneous epistaxis, hematuria or hematochezia.  I have reviewed the past medical history, past surgical history, social history and family history with the patient and they are unchanged from previous note.  ALLERGIES:  has No Known Allergies.  MEDICATIONS:  Current Outpatient Prescriptions  Medication Sig Dispense Refill  . acetaminophen (TYLENOL) 500 MG tablet Take 500 mg by mouth every 6 (six) hours as needed.      . enoxaparin  (LOVENOX) 40 MG/0.4ML injection Inject 0.4 mLs (40 mg total) into the skin daily.  30 Syringe  9  . heparin 5000 UNIT/ML injection Inject 1 mL (5,000 Units total) into the skin 2 (two) times daily.  1 mL  6  . heparin 5000 UNIT/ML injection Inject 1 mL (5,000 Units total) into the skin 2 (two) times daily.  1 mL  10  . Prenat-Fe Carbonyl-FA-Omega 3 (ONE-A-DAY WOMENS PRENATAL 1 PO) Take by mouth daily.      . valACYclovir (VALTREX) 500 MG tablet Take 500 mg by mouth daily.      Marland Kitchen zolpidem (AMBIEN) 5 MG tablet Take 5 mg by mouth at bedtime as needed for sleep.       No current facility-administered medications for this visit.     REVIEW OF SYSTEMS:   Constitutional: Denies fevers, chills or night sweats Eyes: Denies blurriness of vision Ears, nose, mouth, throat, and face: Denies mucositis or sore throat Respiratory: Denies cough, dyspnea or wheezes Cardiovascular: Denies palpitation, chest discomfort or lower extremity swelling Gastrointestinal:  Denies nausea, heartburn or change in bowel habits Skin: Denies abnormal skin rashes Lymphatics: Denies new lymphadenopathy or easy bruising Neurological:Denies numbness, tingling or new weaknesses Behavioral/Psych: Mood is stable, no new changes  All other systems were reviewed with the patient and are negative.  PHYSICAL EXAMINATION: ECOG PERFORMANCE STATUS: 0 - Asymptomatic  Filed Vitals:   12/08/13 0823  BP: 145/88  Pulse: 97  Temp: 97.9 F (36.6 C)  Resp: 18   Filed Weights   12/08/13 0823  Weight: 235 lb (106.595 kg)    GENERAL:alert, no distress and comfortable SKIN: skin color, texture, turgor are normal, no rashes or significant lesions. Minor bruising  is noted EYES: normal, Conjunctiva are pink and non-injected, sclera clear OROPHARYNX:no exudate, no erythema and lips, buccal mucosa, and tongue normal  Musculoskeletal:no cyanosis of digits and no clubbing  NEURO: alert & oriented x 3 with fluent speech, no focal  motor/sensory deficits  LABORATORY DATA:  I have reviewed the data as listed Results for orders placed in visit on 12/08/13 (from the past 48 hour(s))  CBC WITH DIFFERENTIAL     Status: Abnormal   Collection Time    12/08/13  8:13 AM      Result Value Ref Range   WBC 11.4 (*) 3.9 - 10.3 10e3/uL   NEUT# 6.7 (*) 1.5 - 6.5 10e3/uL   HGB 11.5 (*) 11.6 - 15.9 g/dL   HCT 34.8  34.8 - 46.6 %   Platelets 202  145 - 400 10e3/uL   MCV 88.6  79.5 - 101.0 fL   MCH 29.2  25.1 - 34.0 pg   MCHC 32.9  31.5 - 36.0 g/dL   RBC 3.93  3.70 - 5.45 10e6/uL   RDW 13.4  11.2 - 14.5 %   lymph# 3.5 (*) 0.9 - 3.3 10e3/uL   MONO# 0.8  0.1 - 0.9 10e3/uL   Eosinophils Absolute 0.3  0.0 - 0.5 10e3/uL   Basophils Absolute 0.0  0.0 - 0.1 10e3/uL   NEUT% 58.7  38.4 - 76.8 %   LYMPH% 31.1  14.0 - 49.7 %   MONO% 7.4  0.0 - 14.0 %   EOS% 2.4  0.0 - 7.0 %   BASO% 0.4  0.0 - 2.0 %    Lab Results  Component Value Date   WBC 11.4* 12/08/2013   HGB 11.5* 12/08/2013   HCT 34.8 12/08/2013   MCV 88.6 12/08/2013   PLT 202 12/08/2013   ASSESSMENT & PLAN:  DVT prophylaxis She is doing well with Lovenox. She is approaching 37 weeks of pregnancy. I plan to switch her to heparin subcutaneous 5000 units twice a day until closer the time of delivery. So far, she is doing well with the injections apart from only bruising. The patient denies any recent signs or symptoms of bleeding such as spontaneous epistaxis, hematuria or hematochezia. Due to risk of heparin-induced thrombocytopenia, she will come here on a weekly basis to get CBC checked. I plan to see her closer the time of delivery to make sure she does not have complication from the injections.     Anemia of other chronic disease This is likely anemia of chronic disease. The patient denies recent history of bleeding such as epistaxis, hematuria or hematochezia. She is asymptomatic from the anemia. We will observe for now. She will continue taking prenatal vitamin.     Leukocytosis, unspecified Likely related to pregnancy. She has no signs of infection. Observed only.    All questions were answered. The patient knows to call the clinic with any problems, questions or concerns. No barriers to learning was detected.  I spent 15 minutes counseling the patient face to face. The total time spent in the appointment was 20 minutes and more than 50% was on counseling.     [facility], [place], MD 12/08/2013 8:39 AM

## 2013-12-08 NOTE — Assessment & Plan Note (Signed)
Likely related to pregnancy. She has no signs of infection. Observed only.

## 2013-12-08 NOTE — Telephone Encounter (Signed)
Pt confirmed labs/ov per 07/16 POF, gave pt AVS......KJ °

## 2013-12-08 NOTE — Assessment & Plan Note (Signed)
She is doing well with Lovenox. She is approaching 37 weeks of pregnancy. I plan to switch her to heparin subcutaneous 5000 units twice a day until closer the time of delivery. So far, she is doing well with the injections apart from only bruising. The patient denies any recent signs or symptoms of bleeding such as spontaneous epistaxis, hematuria or hematochezia. Due to risk of heparin-induced thrombocytopenia, she will come here on a weekly basis to get CBC checked. I plan to see her closer the time of delivery to make sure she does not have complication from the injections.

## 2013-12-08 NOTE — Assessment & Plan Note (Signed)
This is likely anemia of chronic disease. The patient denies recent history of bleeding such as epistaxis, hematuria or hematochezia. She is asymptomatic from the anemia. We will observe for now. She will continue taking prenatal vitamin.

## 2013-12-11 ENCOUNTER — Other Ambulatory Visit: Payer: Self-pay | Admitting: *Deleted

## 2013-12-11 ENCOUNTER — Telehealth: Payer: Self-pay | Admitting: *Deleted

## 2013-12-11 ENCOUNTER — Other Ambulatory Visit: Payer: Self-pay | Admitting: Hematology and Oncology

## 2013-12-11 DIAGNOSIS — Z299 Encounter for prophylactic measures, unspecified: Secondary | ICD-10-CM

## 2013-12-11 MED ORDER — HEPARIN SODIUM (PORCINE) 5000 UNIT/ML IJ SOLN: 5000.0000 [IU] | Freq: Two times a day (BID) | INTRAMUSCULAR | Status: DC

## 2013-12-11 NOTE — Telephone Encounter (Signed)
Spoke with patient regarding heparin. She wants to use [facility] and understands they will have to instruct her on how to draw up the heparin

## 2013-12-11 NOTE — Telephone Encounter (Signed)
Pt notified of heparin being called into [facility] outpatient pharmacy

## 2013-12-12 ENCOUNTER — Telehealth: Payer: Self-pay | Admitting: *Deleted

## 2013-12-12 NOTE — Telephone Encounter (Signed)
Pt notified that she does not need labs this week as she just got heparin prescription filled

## 2013-12-15 ENCOUNTER — Other Ambulatory Visit: Payer: Commercial Indemnity

## 2013-12-22 ENCOUNTER — Telehealth: Payer: Self-pay | Admitting: *Deleted

## 2013-12-22 ENCOUNTER — Other Ambulatory Visit (HOSPITAL_BASED_OUTPATIENT_CLINIC_OR_DEPARTMENT_OTHER): Payer: Commercial Indemnity

## 2013-12-22 DIAGNOSIS — O9989 Other specified diseases and conditions complicating pregnancy, childbirth and the puerperium: Secondary | ICD-10-CM

## 2013-12-22 DIAGNOSIS — Z299 Encounter for prophylactic measures, unspecified: Secondary | ICD-10-CM

## 2013-12-22 DIAGNOSIS — D72829 Elevated white blood cell count, unspecified: Secondary | ICD-10-CM

## 2013-12-22 DIAGNOSIS — O99891 Other specified diseases and conditions complicating pregnancy: Secondary | ICD-10-CM

## 2013-12-22 LAB — CBC WITH DIFFERENTIAL/PLATELET
BASO%: 0.4 % (ref 0.0–2.0)
Basophils Absolute: 0 10*3/uL (ref 0.0–0.1)
EOS%: 2.4 % (ref 0.0–7.0)
Eosinophils Absolute: 0.3 10*3/uL (ref 0.0–0.5)
HEMATOCRIT: 34.8 % (ref 34.8–46.6)
HGB: 11.2 g/dL — ABNORMAL LOW (ref 11.6–15.9)
LYMPH%: 28.2 % (ref 14.0–49.7)
MCH: 28.7 pg (ref 25.1–34.0)
MCHC: 32.3 g/dL (ref 31.5–36.0)
MCV: 88.6 fL (ref 79.5–101.0)
MONO#: 0.9 10*3/uL (ref 0.1–0.9)
MONO%: 8.2 % (ref 0.0–14.0)
NEUT#: 6.7 10*3/uL — ABNORMAL HIGH (ref 1.5–6.5)
NEUT%: 60.8 % (ref 38.4–76.8)
PLATELETS: 219 10*3/uL (ref 145–400)
RBC: 3.92 10*6/uL (ref 3.70–5.45)
RDW: 14.1 % (ref 11.2–14.5)
WBC: 11 10*3/uL — ABNORMAL HIGH (ref 3.9–10.3)
lymph#: 3.1 10*3/uL (ref 0.9–3.3)

## 2013-12-22 NOTE — Telephone Encounter (Signed)
Message copied by Cathlean Cower on Fri Dec 22, 2013 11:04 AM ------      Message from: [facility], [place]      Created: Fri Dec 22, 2013  8:33 AM      Regarding: cbc       PLs let her know CBC is ok      ----- Message -----         From: [facility] Interface         Sent: 12/22/2013   8:30 AM           To: Heath Lark, MD                   ------

## 2013-12-22 NOTE — Telephone Encounter (Signed)
Left VM informing pt of labs ok today and keep appt next week as scheduled. Please call us back if any questions.

## 2013-12-26 ENCOUNTER — Encounter: Payer: Self-pay | Admitting: Hematology and Oncology

## 2013-12-26 ENCOUNTER — Telehealth: Payer: Self-pay | Admitting: Hematology and Oncology

## 2013-12-26 ENCOUNTER — Ambulatory Visit (HOSPITAL_BASED_OUTPATIENT_CLINIC_OR_DEPARTMENT_OTHER): Payer: Commercial Indemnity | Admitting: Hematology and Oncology

## 2013-12-26 ENCOUNTER — Other Ambulatory Visit: Payer: Self-pay | Admitting: Hematology and Oncology

## 2013-12-26 VITALS — BP 153/97 | HR 96 | Temp 98.9°F | Resp 19 | Ht 67.0 in | Wt 233.7 lb

## 2013-12-26 DIAGNOSIS — O99891 Other specified diseases and conditions complicating pregnancy: Secondary | ICD-10-CM

## 2013-12-26 DIAGNOSIS — Z86718 Personal history of other venous thrombosis and embolism: Secondary | ICD-10-CM

## 2013-12-26 DIAGNOSIS — Z7901 Long term (current) use of anticoagulants: Secondary | ICD-10-CM

## 2013-12-26 DIAGNOSIS — Z299 Encounter for prophylactic measures, unspecified: Secondary | ICD-10-CM

## 2013-12-26 DIAGNOSIS — O9989 Other specified diseases and conditions complicating pregnancy, childbirth and the puerperium: Secondary | ICD-10-CM

## 2013-12-26 NOTE — Progress Notes (Signed)
[place] OFFICE PROGRESS NOTE  [name], MD SUMMARY OF HEMATOLOGIC HISTORY: History of right lower extremity DVT, currently pregnant, on DVT prophylaxis  According to the patient, she undergo went as bunion surgery on her right foot in December of 2012. She was also taking birth control pill around the time of surgery. She started to notice significant right lower extremity swelling and pain. On 07/01/2011, right lower extremity ultrasound showed acute deep vein thrombosis involving the right distal superficial femoral vein, popliteal vein, posterior tibial and peroneal veins. She was anticoagulated for 3 months and repeat ultrasound reportedly was negative. The patient received treatment with Xarelto with no bleeding complications. She underwent thrombophilia screening and was told it was negative.  The patient had been exposed to birth control pills since age 29. She stopped taking them since the diagnosis of blood clot 2 years ago. There is family history of blood clots in her mother. As mentioned above, she was tested for thrombophilia disorder and he came back negative. The patient was started on Lovenox injection on February 16th, 2015 On 12/08/2013, she switch to heparin subcutaneous injection. INTERVAL HISTORY: [name] 38 y.o. female returns for further followup prior to her due date. She is doing well with the heparin injection. Apart from minor bruising, she denies any bleeding.  I have reviewed the past medical history, past surgical history, social history and family history with the patient and they are unchanged from previous note.  ALLERGIES:  has No Known Allergies.  MEDICATIONS:  Current Outpatient Prescriptions  Medication Sig Dispense Refill  . acetaminophen (TYLENOL) 500 MG tablet Take 500 mg by mouth every 6 (six) hours as needed.      . Prenat-Fe Carbonyl-FA-Omega 3 (ONE-A-DAY WOMENS PRENATAL 1 PO) Take by mouth daily.      .  valACYclovir (VALTREX) 500 MG tablet Take 500 mg by mouth daily.      Marland Kitchen zolpidem (AMBIEN) 5 MG tablet Take 5 mg by mouth at bedtime as needed for sleep.       No current facility-administered medications for this visit.     REVIEW OF SYSTEMS:   Constitutional: Denies fevers, chills or night sweats Eyes: Denies blurriness of vision Ears, nose, mouth, throat, and face: Denies mucositis or sore throat Respiratory: Denies cough, dyspnea or wheezes Cardiovascular: Denies palpitation, chest discomfort or lower extremity swelling Gastrointestinal:  Denies nausea, heartburn or change in bowel habits Skin: Denies abnormal skin rashes Lymphatics: Denies new lymphadenopathy  Neurological:Denies numbness, tingling or new weaknesses Behavioral/Psych: Mood is stable, no new changes  All other systems were reviewed with the patient and are negative.  PHYSICAL EXAMINATION: ECOG PERFORMANCE STATUS: 0 - Asymptomatic  Filed Vitals:   12/26/13 0839  BP: 153/97  Pulse: 96  Temp: 98.9 F (37.2 C)  Resp: 19   Filed Weights   12/26/13 0839  Weight: 233 lb 11.2 oz (106.006 kg)    GENERAL:alert, no distress and comfortable SKIN: skin color, texture, turgor are normal, no rashes or significant lesions EYES: normal, Conjunctiva are pink and non-injected, sclera clear  Musculoskeletal:no cyanosis of digits and no clubbing  NEURO: alert & oriented x 3 with fluent speech, no focal motor/sensory deficits  LABORATORY DATA:  I have reviewed the data as listed No results found for this or any previous visit (from the past 48 hour(s)).  Lab Results  Component Value Date   WBC 11.0* 12/22/2013   HGB 11.2* 12/22/2013   HCT 34.8 12/22/2013   MCV  88.6 12/22/2013   PLT 219 12/22/2013    ASSESSMENT & PLAN:  DVT prophylaxis She is doing well with Heparin SQ. She is approaching 39 weeks of pregnancy. Due date on 01/01/14. If the patient has any signs of labor, I told her to discontinue injection. Due to her  complicated & high risk situation, I recommended induction of labor if she go beyond her due date. So far, she is doing well with the injections apart from only bruising. The patient denies any recent signs or symptoms of bleeding such as spontaneous epistaxis, hematuria or hematochezia. Due to risk of heparin-induced thrombocytopenia, she will come here on a weekly basis to get CBC checked. After delivery the patient is aware of the plan to switch her back to Lovenox injection daily for 6 weeks.         All questions were answered. The patient knows to call the clinic with any problems, questions or concerns. No barriers to learning was detected.  I spent 15 minutes counseling the patient face to face. The total time spent in the appointment was 20 minutes   and more than 50% was on counseling.     [place], [place], MD 12/26/2013 9:08 AM

## 2013-12-26 NOTE — Telephone Encounter (Signed)
gv and printed appt sched and avs for pt for Aug and Sept °

## 2013-12-26 NOTE — Assessment & Plan Note (Addendum)
She is doing well with Heparin SQ. She is approaching 39 weeks of pregnancy. Due date on 01/01/14. If the patient has any signs of labor, I told her to discontinue injection. Due to her complicated & high risk situation, I recommended induction of labor if she go beyond her due date. So far, she is doing well with the injections apart from only bruising. The patient denies any recent signs or symptoms of bleeding such as spontaneous epistaxis, hematuria or hematochezia. Due to risk of heparin-induced thrombocytopenia, she will come here on a weekly basis to get CBC checked. After delivery the patient is aware of the plan to switch her back to Lovenox injection daily for 6 weeks.

## 2013-12-29 ENCOUNTER — Telehealth: Payer: Self-pay | Admitting: *Deleted

## 2013-12-29 ENCOUNTER — Other Ambulatory Visit (HOSPITAL_BASED_OUTPATIENT_CLINIC_OR_DEPARTMENT_OTHER): Payer: Commercial Indemnity

## 2013-12-29 DIAGNOSIS — O99891 Other specified diseases and conditions complicating pregnancy: Secondary | ICD-10-CM

## 2013-12-29 DIAGNOSIS — Z299 Encounter for prophylactic measures, unspecified: Secondary | ICD-10-CM

## 2013-12-29 DIAGNOSIS — Z86718 Personal history of other venous thrombosis and embolism: Secondary | ICD-10-CM

## 2013-12-29 DIAGNOSIS — Z7901 Long term (current) use of anticoagulants: Secondary | ICD-10-CM

## 2013-12-29 DIAGNOSIS — O9989 Other specified diseases and conditions complicating pregnancy, childbirth and the puerperium: Secondary | ICD-10-CM

## 2013-12-29 LAB — CBC WITH DIFFERENTIAL/PLATELET
BASO%: 0.4 % (ref 0.0–2.0)
Basophils Absolute: 0 10*3/uL (ref 0.0–0.1)
EOS%: 2.2 % (ref 0.0–7.0)
Eosinophils Absolute: 0.2 10*3/uL (ref 0.0–0.5)
HCT: 37.5 % (ref 34.8–46.6)
HGB: 12.2 g/dL (ref 11.6–15.9)
LYMPH#: 3.4 10*3/uL — AB (ref 0.9–3.3)
LYMPH%: 32.6 % (ref 14.0–49.7)
MCH: 28.9 pg (ref 25.1–34.0)
MCHC: 32.7 g/dL (ref 31.5–36.0)
MCV: 88.5 fL (ref 79.5–101.0)
MONO#: 0.7 10*3/uL (ref 0.1–0.9)
MONO%: 6.9 % (ref 0.0–14.0)
NEUT#: 6 10*3/uL (ref 1.5–6.5)
NEUT%: 57.9 % (ref 38.4–76.8)
Platelets: 217 10*3/uL (ref 145–400)
RBC: 4.23 10*6/uL (ref 3.70–5.45)
RDW: 14.4 % (ref 11.2–14.5)
WBC: 10.4 10*3/uL — ABNORMAL HIGH (ref 3.9–10.3)

## 2013-12-29 NOTE — Telephone Encounter (Signed)
Left VM for pt informing of labs look great per Dr. Homero Fellers.  Please call back if any questions.

## 2013-12-29 NOTE — Telephone Encounter (Signed)
Message copied by Cathlean Cower on Fri Dec 29, 2013 10:08 AM ------      Message from: [facility], [place]      Created: Fri Dec 29, 2013  9:46 AM      Regarding: cbc       Looks great      ----- Message -----         From: [facility] Interface         Sent: 12/29/2013   9:00 AM           To: Heath Lark, MD                   ------

## 2014-01-01 ENCOUNTER — Inpatient Hospital Stay (HOSPITAL_COMMUNITY)
Admission: AD | Admit: 2014-01-01 | Payer: Commercial Indemnity | Source: Ambulatory Visit | Admitting: Obstetrics and Gynecology

## 2014-01-02 ENCOUNTER — Telehealth (HOSPITAL_COMMUNITY): Payer: Self-pay | Admitting: *Deleted

## 2014-01-02 ENCOUNTER — Encounter (HOSPITAL_COMMUNITY): Payer: Self-pay

## 2014-01-02 ENCOUNTER — Inpatient Hospital Stay (HOSPITAL_COMMUNITY)
Admission: RE | Admit: 2014-01-02 | Discharge: 2014-01-06 | DRG: 765 | Disposition: A | Discharge status: Home / Self Care | Payer: Managed Care, Other (non HMO) | Source: Ambulatory Visit | Attending: Obstetrics & Gynecology | Admitting: Obstetrics & Gynecology

## 2014-01-02 DIAGNOSIS — B009 Herpesviral infection, unspecified: Secondary | ICD-10-CM | POA: Diagnosis present

## 2014-01-02 DIAGNOSIS — Z98891 History of uterine scar from previous surgery: Secondary | ICD-10-CM

## 2014-01-02 DIAGNOSIS — Z8249 Family history of ischemic heart disease and other diseases of the circulatory system: Secondary | ICD-10-CM | POA: Diagnosis not present

## 2014-01-02 DIAGNOSIS — Z87410 Personal history of cervical dysplasia: Secondary | ICD-10-CM

## 2014-01-02 DIAGNOSIS — Z299 Encounter for prophylactic measures, unspecified: Secondary | ICD-10-CM

## 2014-01-02 DIAGNOSIS — O9902 Anemia complicating childbirth: Secondary | ICD-10-CM | POA: Diagnosis present

## 2014-01-02 DIAGNOSIS — D649 Anemia, unspecified: Secondary | ICD-10-CM | POA: Diagnosis present

## 2014-01-02 DIAGNOSIS — IMO0002 Reserved for concepts with insufficient information to code with codable children: Secondary | ICD-10-CM | POA: Diagnosis present

## 2014-01-02 DIAGNOSIS — IMO0001 Reserved for inherently not codable concepts without codable children: Secondary | ICD-10-CM

## 2014-01-02 DIAGNOSIS — Z86718 Personal history of other venous thrombosis and embolism: Secondary | ICD-10-CM

## 2014-01-02 DIAGNOSIS — O139 Gestational [pregnancy-induced] hypertension without significant proteinuria, unspecified trimester: Secondary | ICD-10-CM | POA: Diagnosis present

## 2014-01-02 DIAGNOSIS — O480 Post-term pregnancy: Secondary | ICD-10-CM | POA: Diagnosis present

## 2014-01-02 LAB — COMPREHENSIVE METABOLIC PANEL
ALT: 18 U/L (ref 0–35)
AST: 27 U/L (ref 0–37)
Albumin: 2.7 g/dL — ABNORMAL LOW (ref 3.5–5.2)
Alkaline Phosphatase: 210 U/L — ABNORMAL HIGH (ref 39–117)
Anion gap: 12 (ref 5–15)
BUN: 11 mg/dL (ref 6–23)
CHLORIDE: 104 meq/L (ref 96–112)
CO2: 19 mEq/L (ref 19–32)
CREATININE: 0.99 mg/dL (ref 0.50–1.10)
Calcium: 9.6 mg/dL (ref 8.4–10.5)
GFR calc Af Amer: 83 mL/min — ABNORMAL LOW (ref >90)
GFR, EST NON AFRICAN AMERICAN: 71 mL/min — AB (ref >90)
Glucose, Bld: 149 mg/dL — ABNORMAL HIGH (ref 70–99)
Potassium: 4.4 mEq/L (ref 3.7–5.3)
Sodium: 135 mEq/L — ABNORMAL LOW (ref 137–147)
Total Protein: 6.3 g/dL (ref 6.0–8.3)

## 2014-01-02 LAB — CBC
HCT: 34.9 % — ABNORMAL LOW (ref 36.0–46.0)
HEMOGLOBIN: 11.5 g/dL — AB (ref 12.0–15.0)
MCH: 29 pg (ref 26.0–34.0)
MCHC: 33 g/dL (ref 30.0–36.0)
MCV: 87.9 fL (ref 78.0–100.0)
Platelets: 217 10*3/uL (ref 150–400)
RBC: 3.97 MIL/uL (ref 3.87–5.11)
RDW: 14.1 % (ref 11.5–15.5)
WBC: 10.9 10*3/uL — ABNORMAL HIGH (ref 4.0–10.5)

## 2014-01-02 LAB — TYPE AND SCREEN
ABO/RH(D): A POS
Antibody Screen: NEGATIVE

## 2014-01-02 LAB — URIC ACID: Uric Acid, Serum: 6.4 mg/dL (ref 2.4–7.0)

## 2014-01-02 LAB — PROTEIN / CREATININE RATIO, URINE
Creatinine, Urine: 59.72 mg/dL
PROTEIN CREATININE RATIO: 0.17 — AB (ref 0.00–0.15)
Total Protein, Urine: 10.1 mg/dL

## 2014-01-02 LAB — LACTATE DEHYDROGENASE: LDH: 219 U/L (ref 94–250)

## 2014-01-02 MED ORDER — LIDOCAINE HCL (PF) 1 % IJ SOLN: 30.0000 mL | INTRAMUSCULAR | Status: DC | PRN

## 2014-01-02 MED ORDER — IBUPROFEN 600 MG PO TABS
600.0000 mg | ORAL_TABLET | Freq: Four times a day (QID) | ORAL | Status: DC | PRN
Start: 2014-01-02 — End: 2014-01-03

## 2014-01-02 MED ORDER — MISOPROSTOL 25 MCG QUARTER TABLET
25.0000 ug | ORAL_TABLET | ORAL | Status: DC | PRN
  Administered 2014-01-02 – 2014-01-03 (×2): 25 ug via VAGINAL
  Filled 2014-01-02 (×2): qty 0.25

## 2014-01-02 MED ORDER — ACETAMINOPHEN 325 MG PO TABS
650.0000 mg | ORAL_TABLET | ORAL | Status: DC | PRN
  Administered 2014-01-03: 650 mg via ORAL
  Filled 2014-01-02: qty 2

## 2014-01-02 MED ORDER — NALBUPHINE HCL 10 MG/ML IJ SOLN: 10.0000 mg | INTRAMUSCULAR | Status: DC | PRN

## 2014-01-02 MED ORDER — TERBUTALINE SULFATE 1 MG/ML IJ SOLN: 0.2500 mg | Freq: Once | INTRAMUSCULAR | Status: AC | PRN

## 2014-01-02 MED ORDER — OXYTOCIN 40 UNITS IN LACTATED RINGERS INFUSION - SIMPLE MED: 1.0000 m[IU]/min | INTRAVENOUS | Status: DC

## 2014-01-02 MED ORDER — OXYTOCIN BOLUS FROM INFUSION: 500.0000 mL | INTRAVENOUS | Status: DC

## 2014-01-02 MED ORDER — OXYCODONE-ACETAMINOPHEN 5-325 MG PO TABS: 1.0000 | ORAL_TABLET | ORAL | Status: DC | PRN

## 2014-01-02 MED ORDER — ONDANSETRON HCL 4 MG/2ML IJ SOLN
4.0000 mg | Freq: Four times a day (QID) | INTRAMUSCULAR | Status: DC | PRN
Start: 2014-01-02 — End: 2014-01-03

## 2014-01-02 MED ORDER — CITRIC ACID-SODIUM CITRATE 334-500 MG/5ML PO SOLN
30.0000 mL | ORAL | Status: DC | PRN
Start: 2014-01-02 — End: 2014-01-03
  Administered 2014-01-03: 30 mL via ORAL
  Filled 2014-01-02 (×2): qty 15

## 2014-01-02 MED ORDER — LACTATED RINGERS IV SOLN
INTRAVENOUS | Status: DC
  Administered 2014-01-02 – 2014-01-03 (×2): via INTRAVENOUS

## 2014-01-02 MED ORDER — ZOLPIDEM TARTRATE 5 MG PO TABS
5.0000 mg | ORAL_TABLET | Freq: Once | ORAL | Status: AC
  Administered 2014-01-02: 5 mg via ORAL
  Filled 2014-01-02: qty 1

## 2014-01-02 MED ORDER — LACTATED RINGERS IV SOLN
500.0000 mL | INTRAVENOUS | Status: DC | PRN
  Administered 2014-01-03: 500 mL via INTRAVENOUS

## 2014-01-02 MED ORDER — LABETALOL HCL 5 MG/ML IV SOLN
10.0000 mg | INTRAVENOUS | Status: DC | PRN
  Administered 2014-01-02 – 2014-01-03 (×2): 10 mg via INTRAVENOUS
  Filled 2014-01-02: qty 4

## 2014-01-02 MED ORDER — OXYTOCIN 40 UNITS IN LACTATED RINGERS INFUSION - SIMPLE MED: 62.5000 mL/h | INTRAVENOUS | Status: DC

## 2014-01-02 NOTE — Plan of Care (Signed)
Problem: Consults Goal: Birthing Suites Patient Information Press F2 to bring up selections list Outcome: Completed/Met Date Met:  01/02/14  Pt > [redacted] weeks EGA and Inpatient induction

## 2014-01-02 NOTE — H&P (Signed)
Kara Duffy is a 38 y.o. female, G2P0010 at 40.1 weeks, presenting for Induction of Labor secondary to [facility] and on Lovenox due to h/o DVT.  Patient seen in office yesterday, 01/02/2014, by Dr. Octavio Manns and was 0/0/-3.  Patient Active Problem List   Diagnosis Date Noted  . Anemia of other chronic disease 12/08/2013  . Leukocytosis, unspecified 12/08/2013  . DVT prophylaxis 07/10/2013  . Pregnancy 07/10/2013  . Endometrioma of ovary 04/19/2012  . IUD contraception - inserted paragard per pt 06/2011 04/19/2012  . Anticoagulated by anticoagulation treatment 12/30/2011    History of present pregnancy: Patient entered care at 10 weeks.   EDC of 01/01/2014 was established by Definite LMP of 03/27/2013 Anatomy scan:  18 weeks, with normal findings and an posterior placenta.   Additional [place] evaluations:  Growth [place] at 37.1wks: EFW   90th%ile, 7lb15oz (3599g), vtx, posterior placenta, normal fluid. Growth [place] at 39wks: EFW 8 LBS 15 OZ (4051G) (91.1%). AFI 9. CEPHALIC Significant prenatal events:  On Lovenox throughout pregnancy for h/o dvt.  Patient had multiple pregnancy complaints throughout pregnancy.  Patient had [facility] labs at 32wks for complaint of edema of BLE.  Patient also given Flexeril at this time for back pain. Last evaluation:  01/01/2014 at [place] by Dr. Octavio Manns, 0/0/-3  OB History   Grav Para Term Preterm Abortions TAB SAB Ect Mult Living   1 0        0     Past Medical History  Diagnosis Date  . Herpes simplex without mention of complication   . Abnormal Pap smear     cin-I  . H/O molluscum contagiosum   . H/O chlamydia infection   . No pertinent past medical history   . DVT (deep venous thrombosis) 2013    right leg - no meds currently  . BV (bacterial vaginosis)     2 wks ago  . Headache(784.0)     otc meds  . Endometriosis   . Allergy   . Anxiety   . Clotting disorder   . DVT prophylaxis 07/10/2013  . Pregnancy 07/10/2013   Past Surgical History  Procedure  Laterality Date  . Dilation and curettage of uterus  2010  . Bunionectomy  2012  . Laparoscopy  03/30/2012    Procedure: LAPAROSCOPY OPERATIVE;  Surgeon: Delice Lesch, MD;  Location: [facility];  Service: Gynecology;  Laterality: N/A;  . Ovarian cyst removal  03/30/2012    Procedure: OVARIAN CYSTECTOMY;  Surgeon: Delice Lesch, MD;  Location: [facility];  Service: Gynecology;  Laterality: Left;   Family History: family history includes Deep vein thrombosis in her mother; Diabetes in her cousin, mother, paternal grandmother, paternal uncle, and paternal uncle; Hyperlipidemia in her father; Hypertension in her father and mother. Social History:  reports that she has never smoked. She has never used smokeless tobacco. She reports that she drinks alcohol. She reports that she does not use illicit drugs.   Prenatal Transfer Tool  Maternal Diabetes: No Genetic Screening: Normal Maternal Ultrasounds/Referrals: Normal Fetal Ultrasounds or other Referrals:  None Maternal Substance Abuse:  No Significant Maternal Medications:  Meds include: Other:  Significant Maternal Lab Results: Lab values include: Group B Strep negative    ROS:  Reports HA this am, that resolved without intervention.  Denies LoF, VB, and contractions.  Reports active fetus.  Denies visual disturbances, SOB, epigastric pain, and numbness/tingling.  Reports feeling "nervous" and contributes this to current elevated bp.  No Known Allergies  There were no vitals taken for this visit.  Chest clear Heart RRR without murmur Abd gravid, NT Pelvic: 0/0/-3, Soft, Posterior Ext: Mild Edema of BLE  FHR: 160 bpm, Mod Var, -Decels, +Accels UCs:  Irregular contractions graphed  Prenatal labs: ABO, Rh:  A Positive Antibody:  Negative Rubella:   Immune RPR:   Negative HBsAg:   Negative HIV:   Negative GBS:  Negative Sickle cell/Hgb electrophoresis:  Normal Pap:  Normal GC:  Negative Chlamydia:  Negative Genetic  screenings:  Normal Glucola:  Positive 1hr, Negative 3hr  Other:  PIH Labs on 6/15-Normal   Assessment IUP at 40.1wks Cat I FT GBS Negative GHTN H/O DVT  Plan: Admit to [facility] per consult with Dr. Octavio Manns Routine Labor and Delivery Orders Routine Induction Orders Discussed POC for tonight including cytotec, possible foley bulb, and pitocin in AM Discussed availability of sleep aides, patient desires 1st Dose cytotec placed at 2030 without difficulties  Lavine Hargrove [id number], MSN 01/02/2014, 7:09 PM   0

## 2014-01-03 ENCOUNTER — Encounter (HOSPITAL_COMMUNITY): Payer: Self-pay

## 2014-01-03 ENCOUNTER — Encounter (HOSPITAL_COMMUNITY): Payer: Managed Care, Other (non HMO) | Admitting: Anesthesiology

## 2014-01-03 ENCOUNTER — Encounter (HOSPITAL_COMMUNITY)
Admission: RE | Disposition: A | Discharge status: Home / Self Care | Payer: Self-pay | Source: Ambulatory Visit | Attending: Obstetrics & Gynecology

## 2014-01-03 ENCOUNTER — Inpatient Hospital Stay (HOSPITAL_COMMUNITY): Payer: Managed Care, Other (non HMO) | Admitting: Anesthesiology

## 2014-01-03 DIAGNOSIS — Z98891 History of uterine scar from previous surgery: Secondary | ICD-10-CM

## 2014-01-03 LAB — CBC
HEMATOCRIT: 33.5 % — AB (ref 36.0–46.0)
HEMOGLOBIN: 11 g/dL — AB (ref 12.0–15.0)
MCH: 29.3 pg (ref 26.0–34.0)
MCHC: 32.8 g/dL (ref 30.0–36.0)
MCV: 89.1 fL (ref 78.0–100.0)
Platelets: 189 10*3/uL (ref 150–400)
RBC: 3.76 MIL/uL — AB (ref 3.87–5.11)
RDW: 14.4 % (ref 11.5–15.5)
WBC: 12 10*3/uL — AB (ref 4.0–10.5)

## 2014-01-03 LAB — PROTIME-INR
INR: 0.98 (ref 0.00–1.49)
Prothrombin Time: 13 seconds (ref 11.6–15.2)

## 2014-01-03 LAB — CREATININE, SERUM
Creatinine, Ser: 0.8 mg/dL (ref 0.50–1.10)
GFR calc Af Amer: >90 mL/min (ref >90)
GFR calc non Af Amer: >90 mL/min (ref >90)

## 2014-01-03 LAB — APTT: aPTT: 30 seconds (ref 24–37)

## 2014-01-03 LAB — RPR

## 2014-01-03 LAB — ABO/RH: ABO/RH(D): A POS

## 2014-01-03 SURGERY — Surgical Case
Anesthesia: Spinal

## 2014-01-03 MED ORDER — KETOROLAC TROMETHAMINE 30 MG/ML IJ SOLN
30.0000 mg | Freq: Four times a day (QID) | INTRAMUSCULAR | Status: AC | PRN
  Administered 2014-01-03 (×2): 30 mg via INTRAMUSCULAR

## 2014-01-03 MED ORDER — IBUPROFEN 600 MG PO TABS
600.0000 mg | ORAL_TABLET | Freq: Four times a day (QID) | ORAL | Status: DC
  Administered 2014-01-03 – 2014-01-06 (×10): 600 mg via ORAL
  Filled 2014-01-03 (×10): qty 1

## 2014-01-03 MED ORDER — LACTATED RINGERS IV SOLN
40.0000 [IU] | INTRAVENOUS | Status: DC | PRN
  Administered 2014-01-03: 40 [IU] via INTRAVENOUS

## 2014-01-03 MED ORDER — PROMETHAZINE HCL 25 MG/ML IJ SOLN: 6.2500 mg | INTRAMUSCULAR | Status: DC | PRN

## 2014-01-03 MED ORDER — LACTATED RINGERS IV SOLN
INTRAVENOUS | Status: DC | PRN
  Administered 2014-01-03 (×3): via INTRAVENOUS

## 2014-01-03 MED ORDER — OXYTOCIN 10 UNIT/ML IJ SOLN
INTRAMUSCULAR | Status: AC
  Filled 2014-01-03: qty 4

## 2014-01-03 MED ORDER — PRENATAL MULTIVITAMIN CH
1.0000 | ORAL_TABLET | Freq: Every day | ORAL | Status: DC
  Administered 2014-01-04 – 2014-01-05 (×2): 1 via ORAL
  Filled 2014-01-03 (×2): qty 1

## 2014-01-03 MED ORDER — ENOXAPARIN SODIUM 40 MG/0.4ML ~~LOC~~ SOLN
40.0000 mg | SUBCUTANEOUS | Status: DC
  Administered 2014-01-04 – 2014-01-06 (×3): 40 mg via SUBCUTANEOUS
  Filled 2014-01-03 (×3): qty 0.4

## 2014-01-03 MED ORDER — ZOLPIDEM TARTRATE 5 MG PO TABS
5.0000 mg | ORAL_TABLET | Freq: Every evening | ORAL | Status: DC | PRN
Start: 2014-01-03 — End: 2014-01-06

## 2014-01-03 MED ORDER — NALBUPHINE HCL 10 MG/ML IJ SOLN: 5.0000 mg | INTRAMUSCULAR | Status: DC | PRN

## 2014-01-03 MED ORDER — PHENYLEPHRINE 8 MG IN D5W 100 ML (0.08MG/ML) PREMIX OPTIME
INJECTION | INTRAVENOUS | Status: DC | PRN
  Administered 2014-01-03: 40 ug/min via INTRAVENOUS

## 2014-01-03 MED ORDER — KETOROLAC TROMETHAMINE 30 MG/ML IJ SOLN: 15.0000 mg | Freq: Once | INTRAMUSCULAR | Status: DC | PRN

## 2014-01-03 MED ORDER — DIPHENHYDRAMINE HCL 25 MG PO CAPS: 25.0000 mg | ORAL_CAPSULE | Freq: Four times a day (QID) | ORAL | Status: DC | PRN

## 2014-01-03 MED ORDER — SIMETHICONE 80 MG PO CHEW
80.0000 mg | CHEWABLE_TABLET | ORAL | Status: DC | PRN
  Administered 2014-01-04 – 2014-01-05 (×4): 80 mg via ORAL
  Filled 2014-01-03 (×4): qty 1

## 2014-01-03 MED ORDER — LACTATED RINGERS IV SOLN
INTRAVENOUS | Status: DC | PRN
  Administered 2014-01-03: 09:00:00 via INTRAVENOUS

## 2014-01-03 MED ORDER — LANOLIN HYDROUS EX OINT: 1.0000 "application " | TOPICAL_OINTMENT | CUTANEOUS | Status: DC | PRN

## 2014-01-03 MED ORDER — CEFAZOLIN SODIUM-DEXTROSE 2-3 GM-% IV SOLR
2.0000 g | INTRAVENOUS | Status: AC
  Administered 2014-01-03: 2 g via INTRAVENOUS
  Filled 2014-01-03: qty 50

## 2014-01-03 MED ORDER — DIPHENHYDRAMINE HCL 50 MG/ML IJ SOLN
12.5000 mg | INTRAMUSCULAR | Status: DC | PRN
Start: 2014-01-03 — End: 2014-01-06

## 2014-01-03 MED ORDER — ONDANSETRON HCL 4 MG/2ML IJ SOLN: 4.0000 mg | Freq: Three times a day (TID) | INTRAMUSCULAR | Status: DC | PRN

## 2014-01-03 MED ORDER — OXYTOCIN 40 UNITS IN LACTATED RINGERS INFUSION - SIMPLE MED: 62.5000 mL/h | INTRAVENOUS | Status: AC

## 2014-01-03 MED ORDER — ONDANSETRON HCL 4 MG PO TABS: 4.0000 mg | ORAL_TABLET | ORAL | Status: DC | PRN

## 2014-01-03 MED ORDER — DIPHENHYDRAMINE HCL 25 MG PO CAPS: 25.0000 mg | ORAL_CAPSULE | ORAL | Status: DC | PRN

## 2014-01-03 MED ORDER — KETOROLAC TROMETHAMINE 30 MG/ML IJ SOLN
30.0000 mg | Freq: Four times a day (QID) | INTRAMUSCULAR | Status: AC | PRN
  Filled 2014-01-03: qty 1

## 2014-01-03 MED ORDER — IBUPROFEN 600 MG PO TABS: 600.0000 mg | ORAL_TABLET | Freq: Four times a day (QID) | ORAL | Status: DC | PRN

## 2014-01-03 MED ORDER — MORPHINE SULFATE 0.5 MG/ML IJ SOLN
INTRAMUSCULAR | Status: AC
  Filled 2014-01-03: qty 10

## 2014-01-03 MED ORDER — BUPIVACAINE IN DEXTROSE 0.75-8.25 % IT SOLN
INTRATHECAL | Status: DC | PRN
  Administered 2014-01-03: 1.6 mL via INTRATHECAL

## 2014-01-03 MED ORDER — DIPHENHYDRAMINE HCL 50 MG/ML IJ SOLN: 25.0000 mg | INTRAMUSCULAR | Status: DC | PRN

## 2014-01-03 MED ORDER — ONDANSETRON HCL 4 MG/2ML IJ SOLN
INTRAMUSCULAR | Status: AC
  Filled 2014-01-03: qty 2

## 2014-01-03 MED ORDER — HYDROMORPHONE HCL PF 1 MG/ML IJ SOLN
INTRAMUSCULAR | Status: AC
  Administered 2014-01-03: 0.5 mg via INTRAVENOUS
  Filled 2014-01-03: qty 1

## 2014-01-03 MED ORDER — KETOROLAC TROMETHAMINE 30 MG/ML IJ SOLN
INTRAMUSCULAR | Status: AC
  Administered 2014-01-03: 30 mg via INTRAMUSCULAR
  Filled 2014-01-03: qty 1

## 2014-01-03 MED ORDER — FENTANYL CITRATE 0.05 MG/ML IJ SOLN
INTRAMUSCULAR | Status: AC
  Filled 2014-01-03: qty 2

## 2014-01-03 MED ORDER — SENNOSIDES-DOCUSATE SODIUM 8.6-50 MG PO TABS
2.0000 | ORAL_TABLET | ORAL | Status: DC
  Administered 2014-01-03 – 2014-01-05 (×3): 2 via ORAL
  Filled 2014-01-03 (×3): qty 2

## 2014-01-03 MED ORDER — LACTATED RINGERS IV SOLN
INTRAVENOUS | Status: DC
  Administered 2014-01-04: 06:00:00 via INTRAVENOUS

## 2014-01-03 MED ORDER — MEPERIDINE HCL 25 MG/ML IJ SOLN: 6.2500 mg | INTRAMUSCULAR | Status: DC | PRN

## 2014-01-03 MED ORDER — SCOPOLAMINE 1 MG/3DAYS TD PT72
1.0000 | MEDICATED_PATCH | Freq: Once | TRANSDERMAL | Status: AC
  Administered 2014-01-03: 1.5 mg via TRANSDERMAL

## 2014-01-03 MED ORDER — MENTHOL 3 MG MT LOZG
1.0000 | LOZENGE | OROMUCOSAL | Status: DC | PRN
  Administered 2014-01-05: 3 mg via ORAL
  Filled 2014-01-03: qty 9

## 2014-01-03 MED ORDER — ERYTHROMYCIN 5 MG/GM OP OINT
TOPICAL_OINTMENT | OPHTHALMIC | Status: AC
  Filled 2014-01-03: qty 1

## 2014-01-03 MED ORDER — MORPHINE SULFATE (PF) 0.5 MG/ML IJ SOLN
INTRAMUSCULAR | Status: DC | PRN
  Administered 2014-01-03: .2 mg via INTRATHECAL

## 2014-01-03 MED ORDER — TETANUS-DIPHTH-ACELL PERTUSSIS 5-2.5-18.5 LF-MCG/0.5 IM SUSP
0.5000 mL | Freq: Once | INTRAMUSCULAR | Status: AC
  Administered 2014-01-04: 0.5 mL via INTRAMUSCULAR
  Filled 2014-01-03: qty 0.5

## 2014-01-03 MED ORDER — DIBUCAINE 1 % RE OINT: 1.0000 "application " | TOPICAL_OINTMENT | RECTAL | Status: DC | PRN

## 2014-01-03 MED ORDER — METOCLOPRAMIDE HCL 5 MG/ML IJ SOLN: 10.0000 mg | Freq: Three times a day (TID) | INTRAMUSCULAR | Status: DC | PRN

## 2014-01-03 MED ORDER — NALOXONE HCL 0.4 MG/ML IJ SOLN: 0.4000 mg | INTRAMUSCULAR | Status: DC | PRN

## 2014-01-03 MED ORDER — ONDANSETRON HCL 4 MG/2ML IJ SOLN
INTRAMUSCULAR | Status: DC | PRN
  Administered 2014-01-03: 4 mg via INTRAVENOUS

## 2014-01-03 MED ORDER — PHENYLEPHRINE HCL 10 MG/ML IJ SOLN
INTRAMUSCULAR | Status: DC | PRN
  Administered 2014-01-03: 40 ug via INTRAVENOUS
  Administered 2014-01-03: 80 ug via INTRAVENOUS

## 2014-01-03 MED ORDER — PHENYLEPHRINE 40 MCG/ML (10ML) SYRINGE FOR IV PUSH (FOR BLOOD PRESSURE SUPPORT)
PREFILLED_SYRINGE | INTRAVENOUS | Status: AC
  Filled 2014-01-03: qty 5

## 2014-01-03 MED ORDER — OXYCODONE-ACETAMINOPHEN 5-325 MG PO TABS
1.0000 | ORAL_TABLET | ORAL | Status: DC | PRN
  Administered 2014-01-04 – 2014-01-06 (×11): 2 via ORAL
  Filled 2014-01-03 (×11): qty 2

## 2014-01-03 MED ORDER — FENTANYL CITRATE 0.05 MG/ML IJ SOLN
INTRAMUSCULAR | Status: DC | PRN
  Administered 2014-01-03: 12.5 ug via INTRATHECAL

## 2014-01-03 MED ORDER — WITCH HAZEL-GLYCERIN EX PADS: 1.0000 "application " | MEDICATED_PAD | CUTANEOUS | Status: DC | PRN

## 2014-01-03 MED ORDER — NALOXONE HCL 1 MG/ML IJ SOLN
1.0000 ug/kg/h | INTRAVENOUS | Status: DC | PRN
  Filled 2014-01-03: qty 2

## 2014-01-03 MED ORDER — PHENYLEPHRINE 8 MG IN D5W 100 ML (0.08MG/ML) PREMIX OPTIME
INJECTION | INTRAVENOUS | Status: AC
  Filled 2014-01-03: qty 100

## 2014-01-03 MED ORDER — HYDROMORPHONE HCL PF 1 MG/ML IJ SOLN
0.2500 mg | INTRAMUSCULAR | Status: DC | PRN
  Administered 2014-01-03 (×2): 0.5 mg via INTRAVENOUS

## 2014-01-03 MED ORDER — CEFAZOLIN SODIUM-DEXTROSE 2-3 GM-% IV SOLR
INTRAVENOUS | Status: AC
  Filled 2014-01-03: qty 50

## 2014-01-03 MED ORDER — ONDANSETRON HCL 4 MG/2ML IJ SOLN: 4.0000 mg | INTRAMUSCULAR | Status: DC | PRN

## 2014-01-03 MED ORDER — TERBUTALINE SULFATE 1 MG/ML IJ SOLN
INTRAMUSCULAR | Status: AC
  Filled 2014-01-03: qty 1

## 2014-01-03 MED ORDER — MEPERIDINE HCL 25 MG/ML IJ SOLN
6.2500 mg | INTRAMUSCULAR | Status: DC | PRN
Start: 2014-01-03 — End: 2014-01-03

## 2014-01-03 MED ORDER — SODIUM CHLORIDE 0.9 % IJ SOLN: 3.0000 mL | INTRAMUSCULAR | Status: DC | PRN

## 2014-01-03 MED ORDER — SCOPOLAMINE 1 MG/3DAYS TD PT72
MEDICATED_PATCH | TRANSDERMAL | Status: AC
  Filled 2014-01-03: qty 1

## 2014-01-03 SURGICAL SUPPLY — 36 items
ADH SKN CLS LQ APL DERMABOND (GAUZE/BANDAGES/DRESSINGS) ×1
BLADE SURG 10 STRL SS (BLADE) ×6 IMPLANT
CLAMP CORD UMBIL (MISCELLANEOUS) IMPLANT
CLOTH BEACON ORANGE TIMEOUT ST (SAFETY) ×3 IMPLANT
CONTAINER PREFILL 10% NBF 15ML (MISCELLANEOUS) IMPLANT
DERMABOND ADHESIVE PROPEN (GAUZE/BANDAGES/DRESSINGS) ×2
DERMABOND ADVANCED .7 DNX6 (GAUZE/BANDAGES/DRESSINGS) ×1 IMPLANT
DRAPE LG THREE QUARTER DISP (DRAPES) IMPLANT
DRSG OPSITE POSTOP 4X10 (GAUZE/BANDAGES/DRESSINGS) ×3 IMPLANT
DURAPREP 26ML APPLICATOR (WOUND CARE) ×3 IMPLANT
ELECT REM PT RETURN 9FT ADLT (ELECTROSURGICAL) ×3
ELECTRODE REM PT RTRN 9FT ADLT (ELECTROSURGICAL) ×1 IMPLANT
EXTRACTOR VACUUM M CUP 4 TUBE (SUCTIONS) IMPLANT
EXTRACTOR VACUUM M CUP 4' TUBE (SUCTIONS)
GLOVE BIOGEL PI IND STRL 7.0 (GLOVE) ×1 IMPLANT
GLOVE BIOGEL PI INDICATOR 7.0 (GLOVE) ×2
GLOVE SURG SS PI 6.5 STRL IVOR (GLOVE) ×3 IMPLANT
GOWN STRL REUS W/TWL LRG LVL3 (GOWN DISPOSABLE) ×6 IMPLANT
KIT ABG SYR 3ML LUER SLIP (SYRINGE) IMPLANT
NDL HYPO 25X5/8 SAFETYGLIDE (NEEDLE) IMPLANT
NEEDLE HYPO 25X5/8 SAFETYGLIDE (NEEDLE) IMPLANT
NS IRRIG 1000ML POUR BTL (IV SOLUTION) ×3 IMPLANT
PACK C SECTION WH (CUSTOM PROCEDURE TRAY) ×3 IMPLANT
PAD OB MATERNITY 4.3X12.25 (PERSONAL CARE ITEMS) ×3 IMPLANT
RTRCTR C-SECT PINK 25CM LRG (MISCELLANEOUS) ×3 IMPLANT
SUT CHROMIC 1 CTX 36 (SUTURE) ×3 IMPLANT
SUT MON AB 4-0 PS1 27 (SUTURE) IMPLANT
SUT PLAIN 1 NONE 54 (SUTURE) IMPLANT
SUT PLAIN 2 0 XLH (SUTURE) ×2 IMPLANT
SUT VIC AB 0 CT1 36 (SUTURE) ×3 IMPLANT
SUT VIC AB 1 CT1 36 (SUTURE) ×3 IMPLANT
SUT VIC AB 1 CTX 36 (SUTURE) ×9
SUT VIC AB 1 CTX36XBRD ANBCTRL (SUTURE) IMPLANT
TOWEL OR 17X24 6PK STRL BLUE (TOWEL DISPOSABLE) ×3 IMPLANT
TRAY FOLEY CATH 14FR (SET/KITS/TRAYS/PACK) ×3 IMPLANT
WATER STERILE IRR 1000ML POUR (IV SOLUTION) ×3 IMPLANT

## 2014-01-03 NOTE — Anesthesia Procedure Notes (Signed)
Spinal  Patient location during procedure: OR Start time: 01/03/2014 8:52 AM End time: 01/03/2014 8:56 AM Staffing Anesthesiologist: Lyn Hollingshead Performed by: anesthesiologist  Preanesthetic Checklist Completed: patient identified, surgical consent, pre-op evaluation, timeout performed, IV checked, risks and benefits discussed and monitors and equipment checked Spinal Block Patient position: sitting Prep: site prepped and draped and DuraPrep Patient monitoring: heart rate, cardiac monitor, continuous pulse ox and blood pressure Approach: midline Location: L3-4 Injection technique: single-shot Needle Needle type: Pencan  Needle gauge: 24 G Needle length: 9 cm Needle insertion depth: 7 cm Assessment Sensory level: T4

## 2014-01-03 NOTE — Op Note (Signed)
Cesarean Section Procedure Note   RILEA ARUTYUNYAN  01/02/2014 - 01/03/2014  Indications: Recurrent prolonged and variable decelerations.    Pre-operative Diagnosis: non-reassuring fetal heart rate.   Post-operative Diagnosis: Same   Surgeon: Surgeon(s) and Role:    * Seraphina Mitchner Karren Burly, MD - Primary   Assistants: Maeola Sarah, CNM.  Anesthesia: spinal   Procedure Details:  The patient was seen in the Holding Room. The risks, benefits, complications, treatment options, and expected outcomes were discussed with the patient. The patient concurred with the proposed plan, giving informed consent. identified as [name] and the procedure verified as C-Section Delivery. A Time Out was held and the above information confirmed.  After induction of anesthesia,fetal heart rate was noted to drop to about 70s therefore everything proceeded very quickly from there.  Betadine was poured over abdomen then dried with a blue towel.  The patient was draped. A transverse was made and carried down through the subcutaneous tissue to the fascia. Fascial incision was made and extended transversely. The fascia was separated from the underlying rectus tissue superiorly and inferiorly. The peritoneum was identified and entered. Peritoneal incision was extended longitudinally. The utero-vesical peritoneal reflection was incised transversely and the bladder flap was bluntly freed from the lower uterine segment. A low transverse uterine incision was made. Membranes were ruptured, thick meconium noted.  Delivered from cephalic presentation was a Female with Apgar scores of 8 at one minute and 9 at five minutes. Nuchal cord noted. Cord ph was sent the umbilical cord was clamped and cut cord blood was obtained for evaluation. The placenta was removed Intact and appeared normal. The uterine outline, tubes and ovaries appeared normal}. The uterine incision was closed with running locked sutures of 1-0Vicryl.   Hemostasis  was observed. Lavage was carried out until clear. The rectus muscles was re-approximated in interrupted sutures using 3-0 chromic.  The fascia was then reapproximated with running sutures of 0Vicryl. Irrigation was applied.  Small punctate bleeders were contained with the bovie.  The subcuticular closure on skin was performed using 4-0 monocryl. Dermabond was applied.  Honey comb was applied then pressure dressing.      Instrument, sponge, and needle counts were correct prior the abdominal closure and were correct at the conclusion of the case.    Findings: Nuchal cord. Viable female.     Estimated Blood Loss: 700 cc   Total IV Fluids: per anesthesia.    Urine Output: 600CC OF clear urine  Specimens: Placenta, cord pH.   Complications: no complications  Disposition: PACU - hemodynamically stable.   Maternal Condition: stable   Baby condition / location:  Couplet care / Skin to Skin  Attending Attestation: I was present and scrubbed for the entire procedure.   Signed: Surgeon(s): Lylla Eifler Karren Burly, MD

## 2014-01-03 NOTE — Progress Notes (Signed)
  Subjective: -Call from nurse regarding EFM strip.    Objective: BP 136/75  Pulse 85  Temp(Src) 98 F (36.7 C) (Oral)  Resp 18  Ht 5\' 7"  (1.702 m)  Wt 237 lb (107.502 kg)  BMI 37.11 kg/m2  SpO2 100%  LMP 03/27/2013     FHT:  155 bpm, Mod Var, + Prolonged Decels, -Accels UC:   None graphed SVE:   Dilation: Closed Effacement (%): Thick Station: -3 Exam by:: Tiara Bartoli, CNM Membranes:Intact Pitocin:None  Assessment:  IUP at 40.2wks Cat II  FT  Induction of Labor  Plan: -Position change, O2 via mask, LR bolus---Cat II FT to Cat I FT -Dr. Octavio Manns & Dr. Carmon Sails updated on patient status. Dr. Alesia Richards advised as below: -Continue to monitor patient -Hold initiation of pitocin until Cat I FT noted  -Will review strip and decide on need for primary c/s -Cathlean Marseilles, CNM updated on POC  [facility], Walda Hertzog LYNN,CNM, MSN 01/03/2014, 6:56 AM

## 2014-01-03 NOTE — Transfer of Care (Signed)
Immediate Anesthesia Transfer of Care Note  Patient: Kara Duffy  Procedure(s) Performed: Procedure(s): CESAREAN SECTION (N/A)  Patient Location: PACU  Anesthesia Type:Spinal  Level of Consciousness: awake, alert , oriented and patient cooperative  Airway & Oxygen Therapy: Patient Spontanous Breathing  Post-op Assessment: Report given to PACU RN and Post -op Vital signs reviewed and stable  Post vital signs: Reviewed and stable  Complications: No apparent anesthesia complications

## 2014-01-03 NOTE — Anesthesia Postprocedure Evaluation (Signed)
  Anesthesia Post-op Note  Patient: Kara Duffy  Procedure(s) Performed: Procedure(s): CESAREAN SECTION (N/A)  Patient Location: PACU  Anesthesia Type:Spinal  Level of Consciousness: awake, alert  and oriented  Airway and Oxygen Therapy: Patient Spontanous Breathing  Post-op Pain: none  Post-op Assessment: Post-op Vital signs reviewed, Patient's Cardiovascular Status Stable, Respiratory Function Stable, Patent Airway, No signs of Nausea or vomiting, Pain level controlled, No headache and No backache  Post-op Vital Signs: Reviewed and stable  Last Vitals:  Filed Vitals:   01/03/14 0802  BP: 161/94  Pulse: 80  Temp:   Resp:     Complications: No apparent anesthesia complications

## 2014-01-03 NOTE — Anesthesia Preprocedure Evaluation (Signed)
Anesthesia Evaluation  Patient identified by MRN, date of birth, ID band Patient awake    Reviewed: Allergy & Precautions, H&P , NPO status , Patient's Chart, lab work & pertinent test results  Airway Mallampati: I TM Distance: >3 FB Neck ROM: full    Dental no notable dental hx.    Pulmonary neg pulmonary ROS,    Pulmonary exam normal       Cardiovascular negative cardio ROS      Neuro/Psych    GI/Hepatic negative GI ROS, Neg liver ROS,   Endo/Other  negative endocrine ROS  Renal/GU negative Renal ROS     Musculoskeletal   Abdominal Normal abdominal exam  (+)   Peds  Hematology   Anesthesia Other Findings   Reproductive/Obstetrics (+) Pregnancy                           Anesthesia Physical Anesthesia Plan  ASA: II and emergent  Anesthesia Plan: Spinal   Post-op Pain Management:    Induction:   Airway Management Planned:   Additional Equipment:   Intra-op Plan:   Post-operative Plan:   Informed Consent: I have reviewed the patients History and Physical, chart, labs and discussed the procedure including the risks, benefits and alternatives for the proposed anesthesia with the patient or authorized representative who has indicated his/her understanding and acceptance.     Plan Discussed with: CRNA and Surgeon  Anesthesia Plan Comments:         Anesthesia Quick Evaluation

## 2014-01-03 NOTE — Progress Notes (Signed)
  Subjective: Unaware of contractions--now on right side.  Husband and mother at bedside.  Objective: BP 147/67  Pulse 86  Temp(Src) 98 F (36.7 C) (Oral)  Resp 20  Ht 5\' 7"  (1.702 m)  Wt 237 lb (107.502 kg)  BMI 37.11 kg/m2  SpO2 100%  LMP 03/27/2013       FHT: Category 2--occurrence of spontaneous prolonged decels, x 3-4 min, to nadir of 50-60.   UC:   irregular, mild SVE:   Dilation: Closed Effacement (%): Thick Station: -3 Exam by:: Marcy Panning, CNM Last dose [employer] at 12:30a  Assessment:  Induction due to gestational hypertension, hx DVT (last dose heparin 01/01/14 at 2100) GBS negative Category 2 FHR Unfavorable cervix  Plan: Reviewed status with patient and family. Discussed need for quality UCs to facilitate cervical dilation with necessity of fetal tolerance of process.   Reviewed decisions to be made regarding initiation of pitocin vs C/S, with R&B of each reviewed.  Will consult with Dr. Alesia Richards.  Donnel Saxon CNM 01/03/2014, 7:57 AM

## 2014-01-04 ENCOUNTER — Encounter (HOSPITAL_COMMUNITY): Payer: Self-pay | Admitting: Obstetrics & Gynecology

## 2014-01-04 LAB — CBC
HEMATOCRIT: 29.9 % — AB (ref 36.0–46.0)
HEMOGLOBIN: 9.8 g/dL — AB (ref 12.0–15.0)
MCH: 29.1 pg (ref 26.0–34.0)
MCHC: 32.8 g/dL (ref 30.0–36.0)
MCV: 88.7 fL (ref 78.0–100.0)
Platelets: 190 10*3/uL (ref 150–400)
RBC: 3.37 MIL/uL — ABNORMAL LOW (ref 3.87–5.11)
RDW: 14.6 % (ref 11.5–15.5)
WBC: 14.5 10*3/uL — AB (ref 4.0–10.5)

## 2014-01-04 MED ORDER — FERROUS SULFATE 325 (65 FE) MG PO TABS
325.0000 mg | ORAL_TABLET | Freq: Two times a day (BID) | ORAL | Status: DC
  Administered 2014-01-04 – 2014-01-06 (×4): 325 mg via ORAL
  Filled 2014-01-04 (×4): qty 1

## 2014-01-04 NOTE — Lactation Note (Signed)
This note was copied from the chart of [place]. Lactation Consultation Note   Initial consult with this mom and baby, full term and 30 hours ld. Mom reports having trouble getting the baby latched deeply. On exam, the baby has a short, anterior tongue frenulum, and an upper lip tie that extends to his gum line. Mom states her and her brother have a space between their teeth. Mom speaks with a lisp, although she denies having any speech problems as a child. Mom allowed me to see under her tongue, and she also has an anterior frenulum, that at this time is not short, but long. I showed mom what I saw, and explained that these findings could or could not impact breast feeding. i also called the pediatrician , Dr. Joneen Caraway, and informed her of my findings and what I told the mom. With finger sucking, the baby was able to get her tongue under my finger. With breast feeding, the baby latched deeply in football hold, and with an asymmetrical latch, appeared deep. Mom's breast was moving well, baby  Stayed latched and sucking for 35 minutes, and mom denies any discomfort with latch. Her nipple appeared WNL after latch, all good signs. Lactation an community resources reviewed with mo, and breast feeding pages in the Baby and Me book also reviewed. Mom knows to call for lactation as needed.  Patient Name: Kara Duffy TIRWE'R Date: 01/04/2014 Reason for consult: Initial assessment   Maternal Data Formula Feeding for Exclusion: No Has patient been taught Hand Expression?: Yes Does the patient have breastfeeding experience prior to this delivery?: No  Feeding Feeding Type: Breast Fed Length of feed: 35 min  LATCH Score/Interventions Latch: Grasps breast easily, tongue down, lips flanged, rhythmical sucking. Intervention(s): Adjust position;Assist with latch;Breast compression  Audible Swallowing: A few with stimulation Intervention(s): Skin to skin;Hand expression  Type of Nipple:  Everted at rest and after stimulation  Comfort (Breast/Nipple): Soft / non-tender     Hold (Positioning): Assistance needed to correctly position infant at breast and maintain latch. Intervention(s): Breastfeeding basics reviewed;Support Pillows;Position options;Skin to skin  LATCH Score: 8  Lactation Tools Discussed/Used     Consult Status Consult Status: Follow-up Date: 01/05/14 Follow-up type: In-patient    Tonna Corner 01/04/2014, 3:57 PM

## 2014-01-04 NOTE — Anesthesia Postprocedure Evaluation (Signed)
Anesthesia Post Note  Patient: Kara Duffy  Procedure(s) Performed: Procedure(s) (LRB): CESAREAN SECTION (N/A)  Anesthesia type: Spinal  Patient location: Mother/Baby  Post pain: Pain level controlled  Post assessment: Post-op Vital signs reviewed  Last Vitals:  Filed Vitals:   01/04/14 0530  BP: 127/66  Pulse: 93  Temp: 37.4 C  Resp: 18    Post vital signs: Reviewed  Level of consciousness: awake  Complications: No apparent anesthesia complications

## 2014-01-04 NOTE — Addendum Note (Signed)
Addendum created 01/04/14 0748 by Flossie Dibble, CRNA   Modules edited: Notes Section   Notes Section:  File: [id number]

## 2014-01-04 NOTE — Progress Notes (Addendum)
Subjective: Postpartum Day 1: Cesarean Delivery due to [facility] Patient up to BR x 1.  Foley removed this am, patient awaiting spontaneous void. Feeding:  Breast Contraceptive plan:  Undecided Hx DVT in past, has been on anticoagulation during pregnancy.  Objective: Vital signs in last 24 hours: Temp:  [97.6 F (36.4 C)-99.3 F (37.4 C)] 99.3 F (37.4 C) (08/13 0530) Pulse Rate:  [73-102] 93 (08/13 0530) Resp:  [14-20] 18 (08/13 0530) BP: (102-143)/(48-94) 127/66 mmHg (08/13 0530) SpO2:  [96 %-100 %] 97 % (08/13 0430)  Physical Exam:  General: alert Lochia: appropriate Uterine Fundus: firm Abdomen:  + bowel sounds, mild gaseous distension Incision: Dressing CDI DVT Evaluation: No evidence of DVT seen on physical exam. Negative Homan's sign.   Recent Labs  01/02/14 2000 01/03/14 0850 01/04/14 0630  HGB 11.5* 11.0* 9.8*  HCT 34.9* 33.5* 29.9*  WBC 10.9* 12.0* 14.5*    Assessment/Plan: Status post Cesarean section day 1. Early ambulation Doing well postoperatively.  Continue current care. Anticipate spontaneous voiding--cath prn. Lovenox restarted today at 40 mg.  Anticipate continuing x 6 weeks.    Donnel Saxon CNM, [place] 01/04/2014, 9:26 AM

## 2014-01-05 ENCOUNTER — Other Ambulatory Visit: Payer: Commercial Indemnity

## 2014-01-05 MED ORDER — EPINEPHRINE TOPICAL FOR CIRCUMCISION 0.1 MG/ML: 1.0000 [drp] | TOPICAL | Status: DC | PRN

## 2014-01-05 MED ORDER — SUCROSE 24% NICU/PEDS ORAL SOLUTION
0.5000 mL | OROMUCOSAL | Status: DC | PRN
  Filled 2014-01-05: qty 0.5

## 2014-01-05 MED ORDER — ACETAMINOPHEN FOR CIRCUMCISION 160 MG/5 ML
40.0000 mg | Freq: Once | ORAL | Status: DC
  Filled 2014-01-05: qty 2.5

## 2014-01-05 MED ORDER — ACETAMINOPHEN FOR CIRCUMCISION 160 MG/5 ML
40.0000 mg | ORAL | Status: DC | PRN
  Filled 2014-01-05: qty 2.5

## 2014-01-05 MED ORDER — LIDOCAINE 1%/NA BICARB 0.1 MEQ INJECTION
0.8000 mL | INJECTION | Freq: Once | INTRAVENOUS | Status: DC
  Filled 2014-01-05: qty 1

## 2014-01-05 NOTE — Progress Notes (Addendum)
Subjective: Postpartum Day 2: Cesarean Delivery due to [facility] Patient up ad lib, reports no syncope or dizziness. Feeding:  Breast  Contraceptive plan:  FOB/husband had vasectomy  Objective: Vital signs in last 24 hours: Temp:  [98.2 F (36.8 C)-99 F (37.2 C)] 98.2 F (36.8 C) (08/14 0535) Pulse Rate:  [88-101] 101 (08/14 0900) Resp:  [18] 18 (08/14 0900) BP: (129-165)/(70-88) 149/80 mmHg (08/14 0900)  Physical Exam:  General: alert and cooperative Lochia: appropriate Uterine Fundus: firm Abdomen:  + bowel sounds, non distended Incision: no significant drainage  Honeycomb dressing CDI DVT Evaluation: No evidence of DVT seen on physical exam. Homan's sign: Negative   Recent Labs  01/02/14 2000 01/03/14 0850 01/04/14 0630  HGB 11.5* 11.0* 9.8*  HCT 34.9* 33.5* 29.9*  WBC 10.9* 12.0* 14.5*    Assessment/Plan: Status post Cesarean section day 2. Doing well postoperatively.  Continue current care. Plan for discharge tomorrow, Breastfeeding, Lactation consult, Circumcision prior to discharge and Contraception vasectomy Honeycomb dressing in place, no significant drainage JP drain:   n/a Anemia - hemodynamicly stable.     Kara Duffy, CNM, MSN 01/05/2014. 10:11 AM

## 2014-01-05 NOTE — Discharge Instructions (Signed)
Vaginal Delivery, Care After °Refer to this sheet in the next few weeks. These discharge instructions provide you with information on caring for yourself after delivery. Your caregiver may also give you specific instructions. Your treatment has been planned according to the most current medical practices available, but problems sometimes occur. Call your caregiver if you have any problems or questions after you go home. °HOME CARE INSTRUCTIONS °· Take over-the-counter or prescription medicines only as directed by your caregiver or pharmacist. °· Do not drink alcohol, especially if you are breastfeeding or taking medicine to relieve pain. °· Do not chew or smoke tobacco. °· Do not use illegal drugs. °· Continue to use good perineal care. Good perineal care includes: °¨ Wiping your perineum from front to back. °¨ Keeping your perineum clean. °· Do not use tampons or douche until your caregiver says it is okay. °· Shower, wash your hair, and take tub baths as directed by your caregiver. °· Wear a well-fitting bra that provides breast support. °· Eat healthy foods. °· Drink enough fluids to keep your urine clear or pale yellow. °· Eat high-fiber foods such as whole grain cereals and breads, brown rice, beans, and fresh fruits and vegetables every day. These foods may help prevent or relieve constipation. °· Follow your caregiver's recommendations regarding resumption of activities such as climbing stairs, driving, lifting, exercising, or traveling. °· Talk to your caregiver about resuming sexual activities. Resumption of sexual activities is dependent upon your risk of infection, your rate of healing, and your comfort and desire to resume sexual activity. °· Try to have someone help you with your household activities and your newborn for at least a few days after you leave the hospital. °· Rest as much as possible. Try to rest or take a nap when your newborn is sleeping. °· Increase your activities gradually. °· Keep  all of your scheduled postpartum appointments. It is very important to keep your scheduled follow-up appointments. At these appointments, your caregiver will be checking to make sure that you are healing physically and emotionally. °SEEK MEDICAL CARE IF:  °· You are passing large clots from your vagina. Save any clots to show your caregiver. °· You have a foul smelling discharge from your vagina. °· You have trouble urinating. °· You are urinating frequently. °· You have pain when you urinate. °· You have a change in your bowel movements. °· You have increasing redness, pain, or swelling near your vaginal incision (episiotomy) or vaginal tear. °· You have pus draining from your episiotomy or vaginal tear. °· Your episiotomy or vaginal tear is separating. °· You have painful, hard, or reddened breasts. °· You have a severe headache. °· You have blurred vision or see spots. °· You feel sad or depressed. °· You have thoughts of hurting yourself or your newborn. °· You have questions about your care, the care of your newborn, or medicines. °· You are dizzy or light-headed. °· You have a rash. °· You have nausea or vomiting. °· You were breastfeeding and have not had a menstrual period within 12 weeks after you stopped breastfeeding. °· You are not breastfeeding and have not had a menstrual period by the 12th week after delivery. °· You have a fever. °SEEK IMMEDIATE MEDICAL CARE IF:  °· You have persistent pain. °· You have chest pain. °· You have shortness of breath. °· You faint. °· You have leg pain. °· You have stomach pain. °· Your vaginal bleeding saturates two or more sanitary pads   in 1 hour. MAKE SURE YOU:   Understand these instructions.  Will watch your condition.  Will get help right away if you are not doing well or get worse. Document Released: 05/08/2000 Document Revised: 09/25/2013 Document Reviewed: 01/06/2012 [facility] Patient Information 2015 [place], [place]. This information is not intended to  replace advice given to you by your health care provider. Make sure you discuss any questions you have with your health care provider. Postpartum Depression and Baby Blues The postpartum period begins right after the birth of a baby. During this time, there is often a great amount of joy and excitement. It is also a time of many changes in the life of the parents. Regardless of how many times a mother gives birth, each child brings new challenges and dynamics to the family. It is not unusual to have feelings of excitement along with confusing shifts in moods, emotions, and thoughts. All mothers are at risk of developing postpartum depression or the "baby blues." These mood changes can occur right after giving birth, or they may occur many months after giving birth. The baby blues or postpartum depression can be mild or severe. Additionally, postpartum depression can go away rather quickly, or it can be a long-term condition.  CAUSES Raised hormone levels and the rapid drop in those levels are thought to be a main cause of postpartum depression and the baby blues. A number of hormones change during and after pregnancy. Estrogen and progesterone usually decrease right after the delivery of your baby. The levels of thyroid hormone and various cortisol steroids also rapidly drop. Other factors that play a role in these mood changes include major life events and genetics.  RISK FACTORS If you have any of the following risks for the baby blues or postpartum depression, know what symptoms to watch out for during the postpartum period. Risk factors that may increase the likelihood of getting the baby blues or postpartum depression include:  Having a personal or family history of depression.   Having depression while being pregnant.   Having premenstrual mood issues or mood issues related to oral contraceptives.  Having a lot of life stress.   Having marital conflict.   Lacking a social support network.    Having a baby with special needs.   Having health problems, such as diabetes.  SIGNS AND SYMPTOMS Symptoms of baby blues include:  Brief changes in mood, such as going from extreme happiness to sadness.  Decreased concentration.   Difficulty sleeping.   Crying spells, tearfulness.   Irritability.   Anxiety.  Symptoms of postpartum depression typically begin within the first month after giving birth. These symptoms include:  Difficulty sleeping or excessive sleepiness.   Marked weight loss.   Agitation.   Feelings of worthlessness.   Lack of interest in activity or food.  Postpartum psychosis is a very serious condition and can be dangerous. Fortunately, it is rare. Displaying any of the following symptoms is cause for immediate medical attention. Symptoms of postpartum psychosis include:   Hallucinations and delusions.   Bizarre or disorganized behavior.   Confusion or disorientation.  DIAGNOSIS  A diagnosis is made by an evaluation of your symptoms. There are no medical or lab tests that lead to a diagnosis, but there are various questionnaires that a health care provider may use to identify those with the baby blues, postpartum depression, or psychosis. Often, a screening tool called the [place] Postnatal Depression Scale is used to diagnose depression in the postpartum period.  TREATMENT The baby blues usually goes away on its own in 1-2 weeks. Social support is often all that is needed. You will be encouraged to get adequate sleep and rest. Occasionally, you may be given medicines to help you sleep.  Postpartum depression requires treatment because it can last several months or longer if it is not treated. Treatment may include individual or group therapy, medicine, or both to address any social, physiological, and psychological factors that may play a role in the depression. Regular exercise, a healthy diet, rest, and social support may also be  strongly recommended.  Postpartum psychosis is more serious and needs treatment right away. Hospitalization is often needed. HOME CARE INSTRUCTIONS  Get as much rest as you can. Nap when the baby sleeps.   Exercise regularly. Some women find yoga and walking to be beneficial.   Eat a balanced and nourishing diet.   Do little things that you enjoy. Have a cup of tea, take a bubble bath, read your favorite magazine, or listen to your favorite music.  Avoid alcohol.   Ask for help with household chores, cooking, grocery shopping, or running errands as needed. Do not try to do everything.   Talk to people close to you about how you are feeling. Get support from your partner, family members, friends, or other new moms.  Try to stay positive in how you think. Think about the things you are grateful for.   Do not spend a lot of time alone.   Only take over-the-counter or prescription medicine as directed by your health care provider.  Keep all your postpartum appointments.   Let your health care provider know if you have any concerns.  SEEK MEDICAL CARE IF: You are having a reaction to or problems with your medicine. SEEK IMMEDIATE MEDICAL CARE IF:  You have suicidal feelings.   You think you may harm the baby or someone else. MAKE SURE YOU:  Understand these instructions.  Will watch your condition.  Will get help right away if you are not doing well or get worse. Document Released: 02/13/2004 Document Revised: 05/16/2013 Document Reviewed: 02/20/2013 [facility] [place] [facility] Patient Information 2015 [place], [place]. This information is not intended to replace advice given to you by your health care provider. Make sure you discuss any questions you have with your health care provider. Breastfeeding Deciding to breastfeed is one of the best choices you can make for you and your baby. A change in hormones during pregnancy causes your breast tissue to grow and increases the number and  size of your milk ducts. These hormones also allow proteins, sugars, and fats from your blood supply to make breast milk in your milk-producing glands. Hormones prevent breast milk from being released before your baby is born as well as prompt milk flow after birth. Once breastfeeding has begun, thoughts of your baby, as well as his or her sucking or crying, can stimulate the release of milk from your milk-producing glands.  BENEFITS OF BREASTFEEDING For Your Baby  Your first milk (colostrum) helps your baby's digestive system function better.   There are antibodies in your milk that help your baby fight off infections.   Your baby has a lower incidence of asthma, allergies, and sudden infant death syndrome.   The nutrients in breast milk are better for your baby than infant formulas and are designed uniquely for your baby's needs.   Breast milk improves your baby's brain development.   Your baby is less likely to develop other conditions,  such as childhood obesity, asthma, or type 2 diabetes mellitus.  For You   Breastfeeding helps to create a very special bond between you and your baby.   Breastfeeding is convenient. Breast milk is always available at the correct temperature and costs nothing.   Breastfeeding helps to burn calories and helps you lose the weight gained during pregnancy.   Breastfeeding makes your uterus contract to its prepregnancy size faster and slows bleeding (lochia) after you give birth.   Breastfeeding helps to lower your risk of developing type 2 diabetes mellitus, osteoporosis, and breast or ovarian cancer later in life. SIGNS THAT YOUR BABY IS HUNGRY Early Signs of Hunger  Increased alertness or activity.  Stretching.  Movement of the head from side to side.  Movement of the head and opening of the mouth when the corner of the mouth or cheek is stroked (rooting).  Increased sucking sounds, smacking lips, cooing, sighing, or  squeaking.  Hand-to-mouth movements.  Increased sucking of fingers or hands. Late Signs of Hunger  Fussing.  Intermittent crying. Extreme Signs of Hunger Signs of extreme hunger will require calming and consoling before your baby will be able to breastfeed successfully. Do not wait for the following signs of extreme hunger to occur before you initiate breastfeeding:   Restlessness.  A loud, strong cry.   Screaming. BREASTFEEDING BASICS Breastfeeding Initiation  Find a comfortable place to sit or lie down, with your neck and back well supported.  Place a pillow or rolled up blanket under your baby to bring him or her to the level of your breast (if you are seated). Nursing pillows are specially designed to help support your arms and your baby while you breastfeed.  Make sure that your baby's abdomen is facing your abdomen.   Gently massage your breast. With your fingertips, massage from your chest wall toward your nipple in a circular motion. This encourages milk flow. You may need to continue this action during the feeding if your milk flows slowly.  Support your breast with 4 fingers underneath and your thumb above your nipple. Make sure your fingers are well away from your nipple and your baby's mouth.   Stroke your baby's lips gently with your finger or nipple.   When your baby's mouth is open wide enough, quickly bring your baby to your breast, placing your entire nipple and as much of the colored area around your nipple (areola) as possible into your baby's mouth.   More areola should be visible above your baby's upper lip than below the lower lip.   Your baby's tongue should be between his or her lower gum and your breast.   Ensure that your baby's mouth is correctly positioned around your nipple (latched). Your baby's lips should create a seal on your breast and be turned out (everted).  It is common for your baby to suck about 2-3 minutes in order to start the  flow of breast milk. Latching Teaching your baby how to latch on to your breast properly is very important. An improper latch can cause nipple pain and decreased milk supply for you and poor weight gain in your baby. Also, if your baby is not latched onto your nipple properly, he or she may swallow some air during feeding. This can make your baby fussy. Burping your baby when you switch breasts during the feeding can help to get rid of the air. However, teaching your baby to latch on properly is still the best way to prevent  fussiness from swallowing air while breastfeeding. Signs that your baby has successfully latched on to your nipple:    Silent tugging or silent sucking, without causing you pain.   Swallowing heard between every 3-4 sucks.    Muscle movement above and in front of his or her ears while sucking.  Signs that your baby has not successfully latched on to nipple:   Sucking sounds or smacking sounds from your baby while breastfeeding.  Nipple pain. If you think your baby has not latched on correctly, slip your finger into the corner of your baby's mouth to break the suction and place it between your baby's gums. Attempt breastfeeding initiation again. Signs of Successful Breastfeeding Signs from your baby:   A gradual decrease in the number of sucks or complete cessation of sucking.   Falling asleep.   Relaxation of his or her body.   Retention of a small amount of milk in his or her mouth.   Letting go of your breast by himself or herself. Signs from you:  Breasts that have increased in firmness, weight, and size 1-3 hours after feeding.   Breasts that are softer immediately after breastfeeding.  Increased milk volume, as well as a change in milk consistency and color by the fifth day of breastfeeding.   Nipples that are not sore, cracked, or bleeding. Signs That Your [employer] is Getting Enough Milk  Wetting at least 3 diapers in a 24-hour period. The  urine should be clear and pale yellow by age 344 days.  At least 3 stools in a 24-hour period by age 344 days. The stool should be soft and yellow.  At least 3 stools in a 24-hour period by age 70 days. The stool should be seedy and yellow.  No loss of weight greater than 10% of birth weight during the first 63 days of age.  Average weight gain of 4-7 ounces (113-198 g) per week after age 34 days.  Consistent daily weight gain by age 67 days, without weight loss after the age of 2 weeks. After a feeding, your baby may spit up a small amount. This is common. BREASTFEEDING FREQUENCY AND DURATION Frequent feeding will help you make more milk and can prevent sore nipples and breast engorgement. Breastfeed when you feel the need to reduce the fullness of your breasts or when your baby shows signs of hunger. This is called "breastfeeding on demand." Avoid introducing a pacifier to your baby while you are working to establish breastfeeding (the first 4-6 weeks after your baby is born). After this time you may choose to use a pacifier. Research has shown that pacifier use during the first year of a baby's life decreases the risk of sudden infant death syndrome (SIDS). Allow your baby to feed on each breast as long as he or she wants. Breastfeed until your baby is finished feeding. When your baby unlatches or falls asleep while feeding from the first breast, offer the second breast. Because newborns are often sleepy in the first few weeks of life, you may need to awaken your baby to get him or her to feed. Breastfeeding times will vary from baby to baby. However, the following rules can serve as a guide to help you ensure that your baby is properly fed:  Newborns (babies 33 weeks of age or younger) may breastfeed every 1-3 hours.  Newborns should not go longer than 3 hours during the day or 5 hours during the night without breastfeeding.  You should breastfeed  your baby a minimum of 8 times in a 24-hour period  until you begin to introduce solid foods to your baby at around 81 months of age. BREAST MILK PUMPING Pumping and storing breast milk allows you to ensure that your baby is exclusively fed your breast milk, even at times when you are unable to breastfeed. This is especially important if you are going back to work while you are still breastfeeding or when you are not able to be present during feedings. Your lactation consultant can give you guidelines on how long it is safe to store breast milk.  A breast pump is a machine that allows you to pump milk from your breast into a sterile bottle. The pumped breast milk can then be stored in a refrigerator or freezer. Some breast pumps are operated by hand, while others use electricity. Ask your lactation consultant which type will work best for you. Breast pumps can be purchased, but some hospitals and breastfeeding support groups lease breast pumps on a monthly basis. A lactation consultant can teach you how to hand express breast milk, if you prefer not to use a pump.  CARING FOR YOUR BREASTS WHILE YOU BREASTFEED Nipples can become dry, cracked, and sore while breastfeeding. The following recommendations can help keep your breasts moisturized and healthy:  Avoid using soap on your nipples.   Wear a supportive bra. Although not required, special nursing bras and tank tops are designed to allow access to your breasts for breastfeeding without taking off your entire bra or top. Avoid wearing underwire-style bras or extremely tight bras.  Air dry your nipples for 3-89minutes after each feeding.   Use only cotton bra pads to absorb leaked breast milk. Leaking of breast milk between feedings is normal.   Use lanolin on your nipples after breastfeeding. Lanolin helps to maintain your skin's normal moisture barrier. If you use pure lanolin, you do not need to wash it off before feeding your baby again. Pure lanolin is not toxic to your baby. You may also hand  express a few drops of breast milk and gently massage that milk into your nipples and allow the milk to air dry. In the first few weeks after giving birth, some women experience extremely full breasts (engorgement). Engorgement can make your breasts feel heavy, warm, and tender to the touch. Engorgement peaks within 3-5 days after you give birth. The following recommendations can help ease engorgement:  Completely empty your breasts while breastfeeding or pumping. You may want to start by applying warm, moist heat (in the shower or with warm water-soaked hand towels) just before feeding or pumping. This increases circulation and helps the milk flow. If your baby does not completely empty your breasts while breastfeeding, pump any extra milk after he or she is finished.  Wear a snug bra (nursing or regular) or tank top for 1-2 days to signal your body to slightly decrease milk production.  Apply ice packs to your breasts, unless this is too uncomfortable for you.  Make sure that your baby is latched on and positioned properly while breastfeeding. If engorgement persists after 48 hours of following these recommendations, contact your health care provider or a Science writer. OVERALL HEALTH CARE RECOMMENDATIONS WHILE BREASTFEEDING  Eat healthy foods. Alternate between meals and snacks, eating 3 of each per day. Because what you eat affects your breast milk, some of the foods may make your baby more irritable than usual. Avoid eating these foods if you are sure that they  are negatively affecting your baby.  Drink milk, fruit juice, and water to satisfy your thirst (about 10 glasses a day).   Rest often, relax, and continue to take your prenatal vitamins to prevent fatigue, stress, and anemia.  Continue breast self-awareness checks.  Avoid chewing and smoking tobacco.  Avoid alcohol and drug use. Some medicines that may be harmful to your baby can pass through breast milk. It is important  to ask your health care provider before taking any medicine, including all over-the-counter and prescription medicine as well as vitamin and herbal supplements. It is possible to become pregnant while breastfeeding. If birth control is desired, ask your health care provider about options that will be safe for your baby. SEEK MEDICAL CARE IF:   You feel like you want to stop breastfeeding or have become frustrated with breastfeeding.  You have painful breasts or nipples.  Your nipples are cracked or bleeding.  Your breasts are red, tender, or warm.  You have a swollen area on either breast.  You have a fever or chills.  You have nausea or vomiting.  You have drainage other than breast milk from your nipples.  Your breasts do not become full before feedings by the fifth day after you give birth.  You feel sad and depressed.  Your baby is too sleepy to eat well.  Your baby is having trouble sleeping.   Your baby is wetting less than 3 diapers in a 24-hour period.  Your baby has less than 3 stools in a 24-hour period.  Your baby's skin or the white part of his or her eyes becomes yellow.   Your baby is not gaining weight by 15 days of age. SEEK IMMEDIATE MEDICAL CARE IF:   Your baby is overly tired (lethargic) and does not want to wake up and feed.  Your baby develops an unexplained fever. Document Released: 05/11/2005 Document Revised: 05/16/2013 Document Reviewed: 11/02/2012 [employer] Patient Information 2015 [place], [place]. This information is not intended to replace advice given to you by your health care provider. Make sure you discuss any questions you have with your health care provider.

## 2014-01-05 NOTE — Lactation Note (Signed)
This note was copied from the chart of [place]. Lactation Consultation Note: Follow up visit with mom. She reports that baby just fed for 30 minutes. Asleep in visitor's arms. Mom has DEBP in room but is not set up. Setup and cleaning reviewed with mom. Encouraged to pump after nursing to promote milk supply.Mom pumped for 10 minutes- did not obtain any Colostrum. Reports some pain with latch but then eases off after a few minutes. Reviewed signs of a good latch. Mom reports that baby has tight frenulum and lip tie and that Dr Joneen Caraway is aware. I did not look at baby's mouth since he was asleep. Encouraged to page for assist at next feeding.No questions at present.   Patient Name: Kara Duffy [id number] Date: 01/05/2014 Reason for consult: Follow-up assessment   Maternal Data    Feeding    LATCH Score/Interventions                      Lactation Tools Discussed/Used WIC Program: No Pump Review: Setup, frequency, and cleaning Initiated by:: DW Date initiated:: 01/05/14   Consult Status Consult Status: Follow-up Date: 01/06/14 Follow-up type: In-patient    [name] 01/05/2014, 11:49 AM

## 2014-01-05 NOTE — Procedures (Signed)
CIRCUMCISION  Preoperative Diagnosis:  Mother Elects Infant Circumcision  Postoperative Diagnosis:  Mother Elects Infant Circumcision  Procedure:  Mogen Circumcision  Surgeon:  Blakleigh Straw Y, MD  Anesthetic:  Buffered Lidocaine  Disposition:  Prior to the operation, the mother was informed of the circumcision procedure.  A permit was signed.  A "time out" was performed.  Findings:  Normal female penis.  Complications: None  Procedure:                       The infant was placed on the circumcision board.  The infant was given Sweet-ease.  The dorsal penile nerve was anesthetized with buffered lidocaine.  Five minutes were allowed to pass.  The penis was prepped with betadine, and then sterilely draped. The Mogen clamp was placed on the penis.  The excess foreskin was excised.  The clamp was removed revealing good circumcision results.  Hemostasis was adequate.  Gelfoam was placed around the glands of the penis.  The infant was cleaned and then redressed.  He tolerated the procedure well.  The estimated blood loss was minimal.     

## 2014-01-05 NOTE — Discharge Summary (Signed)
Cesarean Section Delivery Discharge Summary  Kara Duffy  DOB:    [birth year] MRN:    [id number] CSN:    [id number]  Date of admission:                  01/02/2014  Date of discharge:                   01/06/2014  Procedures this admission:  NRFHR  Date of Delivery: 01/04/2014  Newborn Data:  Live born female  Birth Weight: 7 lb 7 oz (3374 g) APGAR: 8, 9  Home with mother. Name: Vira Agar Circumcision Plan: inpatient  History of Present Illness:  Kara Duffy is a 38 y.o. female, G2P1001, who presents at 11w2dweeks gestation. [facility]    Her pregnancy has been complicated by:  Patient Active Problem List   Diagnosis Date Noted  . S/P cesarean section 01/03/2014  . Gestational hypertension 01/02/2014  . Advanced maternal age (AMA) in pregnancy 01/02/2014  . Herpes simplex type II infection 01/02/2014  . Anemia of other chronic disease 12/08/2013  . Leukocytosis, unspecified 12/08/2013  . Endometrioma of ovary 04/19/2012  . Anticoagulated by anticoagulation treatment 12/30/2011    Hospital course:  The patient was admitted for IOL.   [facility] She was discharged to home on post op day 3 doing well.  Feeding:  breast  Contraception:  vasectomy  Discharge hemoglobin:  HGB  Date Value Ref Range Status  12/29/2013 12.2  11.6 - 15.9 g/dL Final     Hemoglobin  Date Value Ref Range Status  01/04/2014 9.8* 12.0 - 15.0 g/dL Final  04/12/2013 14.9  12.2 - 16.2 g/dL Final     HCT  Date Value Ref Range Status  01/04/2014 29.9* 36.0 - 46.0 % Final  12/29/2013 37.5  34.8 - 46.6 % Final     HCT, POC  Date Value Ref Range Status  04/12/2013 47.0  37.7 - 47.9 % Final    Discharge Physical Exam:   General: alert and cooperative Lochia: appropriate Uterine Fundus: firm Incision: no  significant drainage DVT Evaluation: No evidence of DVT seen on physical exam.  Intrapartum Procedures: cesarean: low cervical, transverse Postpartum Procedures: none Complications-Operative and Postpartum: none  Discharge Diagnoses: Term Pregnancy-delivered, asymptomatic anemia  Discharge Information:  Activity:           pelvic rest Diet:                routine Medications: PNV, Ibuprofen, Iron and Percocet lovenox 450mx 6 weeks Condition:      stable Filed Vitals:   01/05/14 0535 01/05/14 0900 01/05/14 1816 01/06/14 0532  BP: 151/79 149/80 134/70 126/71  Pulse: 93 101 91 96  Temp: 98.2 F (36.8 C)  98.1 F (36.7 C) 99 F (37.2 C)  TempSrc: Oral  Oral Oral  Resp: _0 Height:      Weight:      SpO2:    99%    Recent Results (from the past 2160 hour(s))  CBC & DIFF AND RETIC     Status: Abnormal   Collection Time    10/18/13  8:58 AM      Result Value Ref Range   WBC 12.1 (*) 3.9 - 10.3 10e3/uL   NEUT# 8.0 (*) 1.5 - 6.5 10e3/uL  HGB 11.3 (*) 11.6 - 15.9 g/dL   HCT 33.2 (*) 34.8 - 46.6 %   Platelets 229  145 - 400 10e3/uL   MCV 89.7  79.5 - 101.0 fL   MCH 30.5  25.1 - 34.0 pg   MCHC 34.0  31.5 - 36.0 g/dL   RBC 3.70  3.70 - 5.45 10e6/uL   RDW 12.7  11.2 - 14.5 %   lymph# 2.9  0.9 - 3.3 10e3/uL   MONO# 0.9  0.1 - 0.9 10e3/uL   Eosinophils Absolute 0.3  0.0 - 0.5 10e3/uL   Basophils Absolute 0.0  0.0 - 0.1 10e3/uL   NEUT% 65.6  38.4 - 76.8 %   LYMPH% 24.3  14.0 - 49.7 %   MONO% 7.2  0.0 - 14.0 %   EOS% 2.7  0.0 - 7.0 %   BASO% 0.2  0.0 - 2.0 %   Retic % 1.79  0.70 - 2.10 %   Retic Ct Abs 66.23  33.70 - 90.70 10e3/uL   Immature Retic Fract 12.90 (*) 1.60 - 03.75 %  BASIC METABOLIC PANEL (OH60)     Status: Abnormal   Collection Time    10/18/13  8:58 AM      Result Value Ref Range   Sodium 139  136 - 145 mEq/L   Potassium 4.0  3.5 - 5.1 mEq/L   Chloride 108  98 - 109 mEq/L   CO2 20 (*) 22 - 29 mEq/L   Glucose 97  70 - 140 mg/dl   BUN 6.2 (*) 7.0  - 26.0 mg/dL   Creatinine 0.7  0.6 - 1.1 mg/dL   Calcium 9.4  8.4 - 10.4 mg/dL   Anion Gap 10  3 - 11 mEq/L  CBC WITH DIFFERENTIAL     Status: Abnormal   Collection Time    11/10/13  8:15 AM      Result Value Ref Range   WBC 10.9 (*) 3.9 - 10.3 10e3/uL   NEUT# 7.0 (*) 1.5 - 6.5 10e3/uL   HGB 11.8  11.6 - 15.9 g/dL   HCT 35.6  34.8 - 46.6 %   Platelets 213  145 - 400 10e3/uL   MCV 90.5  79.5 - 101.0 fL   MCH 30.1  25.1 - 34.0 pg   MCHC 33.2  31.5 - 36.0 g/dL   RBC 3.93  3.70 - 5.45 10e6/uL   RDW 12.9  11.2 - 14.5 %   lymph# 2.7  0.9 - 3.3 10e3/uL   MONO# 0.9  0.1 - 0.9 10e3/uL   Eosinophils Absolute 0.3  0.0 - 0.5 10e3/uL   Basophils Absolute 0.0  0.0 - 0.1 10e3/uL   NEUT% 64.5  38.4 - 76.8 %   LYMPH% 24.8  14.0 - 49.7 %   MONO% 8.1  0.0 - 14.0 %   EOS% 2.3  0.0 - 7.0 %   BASO% 0.3  0.0 - 2.0 %  BASIC METABOLIC PANEL (OV70)     Status: None   Collection Time    11/10/13  8:17 AM      Result Value Ref Range   Sodium 138  136 - 145 mEq/L   Potassium 4.1  3.5 - 5.1 mEq/L   Chloride 108  98 - 109 mEq/L   CO2 22  22 - 29 mEq/L   Glucose 109  70 - 140 mg/dl   BUN 7.7  7.0 - 26.0 mg/dL   Creatinine 0.7  0.6 - 1.1 mg/dL   Calcium 9.7  8.4 - 10.4 mg/dL   Anion Gap 9  3 - 11 mEq/L  OB RESULTS CONSOLE GBS     Status: None   Collection Time    12/05/13 12:00 AM      Result Value Ref Range   GBS Negative    CBC WITH DIFFERENTIAL     Status: Abnormal   Collection Time    12/08/13  8:13 AM      Result Value Ref Range   WBC 11.4 (*) 3.9 - 10.3 10e3/uL   NEUT# 6.7 (*) 1.5 - 6.5 10e3/uL   HGB 11.5 (*) 11.6 - 15.9 g/dL   HCT 34.8  34.8 - 46.6 %   Platelets 202  145 - 400 10e3/uL   MCV 88.6  79.5 - 101.0 fL   MCH 29.2  25.1 - 34.0 pg   MCHC 32.9  31.5 - 36.0 g/dL   RBC 3.93  3.70 - 5.45 10e6/uL   RDW 13.4  11.2 - 14.5 %   lymph# 3.5 (*) 0.9 - 3.3 10e3/uL   MONO# 0.8  0.1 - 0.9 10e3/uL   Eosinophils Absolute 0.3  0.0 - 0.5 10e3/uL   Basophils Absolute 0.0  0.0 - 0.1 10e3/uL    NEUT% 58.7  38.4 - 76.8 %   LYMPH% 31.1  14.0 - 49.7 %   MONO% 7.4  0.0 - 14.0 %   EOS% 2.4  0.0 - 7.0 %   BASO% 0.4  0.0 - 2.0 %  COMPREHENSIVE METABOLIC PANEL (FI43)     Status: Abnormal   Collection Time    12/08/13  8:13 AM      Result Value Ref Range   Sodium 138  136 - 145 mEq/L   Potassium 4.0  3.5 - 5.1 mEq/L   Chloride 110 (*) 98 - 109 mEq/L   CO2 20 (*) 22 - 29 mEq/L   Glucose 106  70 - 140 mg/dl   BUN 9.8  7.0 - 26.0 mg/dL   Creatinine 0.8  0.6 - 1.1 mg/dL   Total Bilirubin 0.23  0.20 - 1.20 mg/dL   Alkaline Phosphatase 143  40 - 150 U/L   AST 24  5 - 34 U/L   ALT 21  0 - 55 U/L   Total Protein 6.2 (*) 6.4 - 8.3 g/dL   Albumin 2.5 (*) 3.5 - 5.0 g/dL   Calcium 9.8  8.4 - 10.4 mg/dL   Anion Gap 9  3 - 11 mEq/L  CBC WITH DIFFERENTIAL     Status: Abnormal   Collection Time    12/22/13  8:19 AM      Result Value Ref Range   WBC 11.0 (*) 3.9 - 10.3 10e3/uL   NEUT# 6.7 (*) 1.5 - 6.5 10e3/uL   HGB 11.2 (*) 11.6 - 15.9 g/dL   HCT 34.8  34.8 - 46.6 %   Platelets 219  145 - 400 10e3/uL   MCV 88.6  79.5 - 101.0 fL   MCH 28.7  25.1 - 34.0 pg   MCHC 32.3  31.5 - 36.0 g/dL   RBC 3.92  3.70 - 5.45 10e6/uL   RDW 14.1  11.2 - 14.5 %   lymph# 3.1  0.9 - 3.3 10e3/uL   MONO# 0.9  0.1 - 0.9 10e3/uL   Eosinophils Absolute 0.3  0.0 - 0.5 10e3/uL   Basophils Absolute 0.0  0.0 - 0.1 10e3/uL   NEUT% 60.8  38.4 - 76.8 %   LYMPH% 28.2  14.0 - 49.7 %  MONO% 8.2  0.0 - 14.0 %   EOS% 2.4  0.0 - 7.0 %   BASO% 0.4  0.0 - 2.0 %  CBC WITH DIFFERENTIAL     Status: Abnormal   Collection Time    12/29/13  8:46 AM      Result Value Ref Range   WBC 10.4 (*) 3.9 - 10.3 10e3/uL   NEUT# 6.0  1.5 - 6.5 10e3/uL   HGB 12.2  11.6 - 15.9 g/dL   HCT 37.5  34.8 - 46.6 %   Platelets 217  145 - 400 10e3/uL   MCV 88.5  79.5 - 101.0 fL   MCH 28.9  25.1 - 34.0 pg   MCHC 32.7  31.5 - 36.0 g/dL   RBC 4.23  3.70 - 5.45 10e6/uL   RDW 14.4  11.2 - 14.5 %   lymph# 3.4 (*) 0.9 - 3.3 10e3/uL   MONO# 0.7   0.1 - 0.9 10e3/uL   Eosinophils Absolute 0.2  0.0 - 0.5 10e3/uL   Basophils Absolute 0.0  0.0 - 0.1 10e3/uL   NEUT% 57.9  38.4 - 76.8 %   LYMPH% 32.6  14.0 - 49.7 %   MONO% 6.9  0.0 - 14.0 %   EOS% 2.2  0.0 - 7.0 %   BASO% 0.4  0.0 - 2.0 %  CBC     Status: Abnormal   Collection Time    01/02/14  8:00 PM      Result Value Ref Range   WBC 10.9 (*) 4.0 - 10.5 K/uL   RBC 3.97  3.87 - 5.11 MIL/uL   Hemoglobin 11.5 (*) 12.0 - 15.0 g/dL   HCT 34.9 (*) 36.0 - 46.0 %   MCV 87.9  78.0 - 100.0 fL   MCH 29.0  26.0 - 34.0 pg   MCHC 33.0  30.0 - 36.0 g/dL   RDW 14.1  11.5 - 15.5 %   Platelets 217  150 - 400 K/uL  RPR     Status: None   Collection Time    01/02/14  8:00 PM      Result Value Ref Range   RPR NON REAC  NON REAC   Comment: Performed at [place]     Status: Abnormal   Collection Time    01/02/14  8:00 PM      Result Value Ref Range   Sodium 135 (*) 137 - 147 mEq/L   Potassium 4.4  3.7 - 5.3 mEq/L   Chloride 104  96 - 112 mEq/L   CO2 19  19 - 32 mEq/L   Glucose, Bld 149 (*) 70 - 99 mg/dL   BUN 11  6 - 23 mg/dL   Creatinine, Ser 0.99  0.50 - 1.10 mg/dL   Calcium 9.6  8.4 - 10.5 mg/dL   Total Protein 6.3  6.0 - 8.3 g/dL   Albumin 2.7 (*) 3.5 - 5.2 g/dL   AST 27  0 - 37 U/L   ALT 18  0 - 35 U/L   Alkaline Phosphatase 210 (*) 39 - 117 U/L   Total Bilirubin <0.2 (*) 0.3 - 1.2 mg/dL   GFR calc non Af Amer 71 (*) >90 mL/min   GFR calc Af Amer 83 (*) >90 mL/min   Comment: (NOTE)     The eGFR has been calculated using the CKD EPI equation.     This calculation has not been validated in all clinical situations.  eGFR's persistently <90 mL/min signify possible Chronic Kidney     Disease.   Anion gap 12  5 - 15  LACTATE DEHYDROGENASE     Status: None   Collection Time    01/02/14  8:00 PM      Result Value Ref Range   LDH 219  94 - 250 U/L  URIC ACID     Status: None   Collection Time    01/02/14  8:00 PM      Result Value Ref  Range   Uric Acid, Serum 6.4  2.4 - 7.0 mg/dL  TYPE AND SCREEN     Status: None   Collection Time    01/02/14  8:00 PM      Result Value Ref Range   ABO/RH(D) A POS     Antibody Screen NEG     Sample Expiration 01/05/2014    ABO/RH     Status: None   Collection Time    01/02/14  8:00 PM      Result Value Ref Range   ABO/RH(D) A POS    PROTEIN / CREATININE RATIO, URINE     Status: Abnormal   Collection Time    01/02/14  8:25 PM      Result Value Ref Range   Creatinine, Urine 59.72     Total Protein, Urine 10.1     Comment: NO NORMAL RANGE ESTABLISHED FOR THIS TEST   PROTEIN CREATININE RATIO 0.17 (*) 0.00 - 0.15  APTT     Status: None   Collection Time    01/03/14  8:50 AM      Result Value Ref Range   aPTT 30  24 - 37 seconds  PROTIME-INR     Status: None   Collection Time    01/03/14  8:50 AM      Result Value Ref Range   Prothrombin Time 13.0  11.6 - 15.2 seconds   INR 0.98  0.00 - 1.49  CBC     Status: Abnormal   Collection Time    01/03/14  8:50 AM      Result Value Ref Range   WBC 12.0 (*) 4.0 - 10.5 K/uL   RBC 3.76 (*) 3.87 - 5.11 MIL/uL   Hemoglobin 11.0 (*) 12.0 - 15.0 g/dL   HCT 33.5 (*) 36.0 - 46.0 %   MCV 89.1  78.0 - 100.0 fL   MCH 29.3  26.0 - 34.0 pg   MCHC 32.8  30.0 - 36.0 g/dL   RDW 14.4  11.5 - 15.5 %   Platelets 189  150 - 400 K/uL  CREATININE, SERUM     Status: None   Collection Time    01/03/14  8:50 AM      Result Value Ref Range   Creatinine, Ser 0.80  0.50 - 1.10 mg/dL   GFR calc non Af Amer >90  >90 mL/min   GFR calc Af Amer >90  >90 mL/min   Comment: (NOTE)     The eGFR has been calculated using the CKD EPI equation.     This calculation has not been validated in all clinical situations.     eGFR's persistently <90 mL/min signify possible Chronic Kidney     Disease.  CBC     Status: Abnormal   Collection Time    01/04/14  6:30 AM      Result Value Ref Range   WBC 14.5 (*) 4.0 - 10.5 K/uL   RBC 3.37 (*) 3.87 - 5.11  MIL/uL    Hemoglobin 9.8 (*) 12.0 - 15.0 g/dL   HCT 29.9 (*) 36.0 - 46.0 %   MCV 88.7  78.0 - 100.0 fL   MCH 29.1  26.0 - 34.0 pg   MCHC 32.8  30.0 - 36.0 g/dL   RDW 14.6  11.5 - 15.5 %   Platelets 190  150 - 400 K/uL  CBC     Status: Abnormal   Collection Time    01/06/14  6:17 AM      Result Value Ref Range   WBC 12.7 (*) 4.0 - 10.5 K/uL   RBC 3.09 (*) 3.87 - 5.11 MIL/uL   Hemoglobin 9.0 (*) 12.0 - 15.0 g/dL   HCT 27.7 (*) 36.0 - 46.0 %   MCV 89.6  78.0 - 100.0 fL   MCH 29.1  26.0 - 34.0 pg   MCHC 32.5  30.0 - 36.0 g/dL   RDW 14.7  11.5 - 15.5 %   Platelets 211  150 - 400 K/uL  COMPREHENSIVE METABOLIC PANEL     Status: Abnormal   Collection Time    01/06/14  6:17 AM      Result Value Ref Range   Sodium 138  137 - 147 mEq/L   Potassium 4.1  3.7 - 5.3 mEq/L   Chloride 104  96 - 112 mEq/L   CO2 23  19 - 32 mEq/L   Glucose, Bld 107 (*) 70 - 99 mg/dL   BUN 12  6 - 23 mg/dL   Creatinine, Ser 1.00  0.50 - 1.10 mg/dL   Calcium 8.6  8.4 - 10.5 mg/dL   Total Protein 5.1 (*) 6.0 - 8.3 g/dL   Albumin 2.1 (*) 3.5 - 5.2 g/dL   AST 33  0 - 37 U/L   ALT 19  0 - 35 U/L   Alkaline Phosphatase 135 (*) 39 - 117 U/L   Total Bilirubin <0.2 (*) 0.3 - 1.2 mg/dL   GFR calc non Af Amer 71 (*) >90 mL/min   GFR calc Af Amer 82 (*) >90 mL/min   Comment: (NOTE)     The eGFR has been calculated using the CKD EPI equation.     This calculation has not been validated in all clinical situations.     eGFR's persistently <90 mL/min signify possible Chronic Kidney     Disease.   Anion gap 11  5 - 15  LACTATE DEHYDROGENASE     Status: None   Collection Time    01/06/14  6:17 AM      Result Value Ref Range   LDH 241  94 - 250 U/L  URIC ACID     Status: None   Collection Time    01/06/14  6:17 AM      Result Value Ref Range   Uric Acid, Serum 6.6  2.4 - 7.0 mg/dL  PROTEIN / CREATININE RATIO, URINE     Status: None   Collection Time    01/06/14  7:22 AM      Result Value Ref Range   Creatinine, Urine  117.83     Total Protein, Urine 12.4     Comment: NO NORMAL RANGE ESTABLISHED FOR THIS TEST   PROTEIN CREATININE RATIO 0.11  0.00 - 0.15   Discharge to: home Lab work in 1 week.   Keep appointment with Dr Alvy Bimler at the cancer center in September.  Follow-up Information   Follow up with [facility]. Schedule an appointment as  soon as possible for a visit in 6 weeks. (Call with any questions or concerns)    Specialty:  Obstetrics and Gynecology   Contact information:   [address] [id number] [place], CNM, MSN  01/05/2014. 9:27 PM  Care After Cesarean Delivery  Refer to this sheet in the next few weeks. These instructions provide you with information on caring for yourself after your procedure. Your caregiver may also give you specific instructions. Your treatment has been planned according to current medical practices, but problems sometimes occur. Call your caregiver if you have any problems or questions after you go home. HOME CARE INSTRUCTIONS  Only take over-the-counter or prescription medicines as directed by your caregiver.  Do not drink alcohol, especially if you are breastfeeding or taking medicine to relieve pain.  Do not chew or smoke tobacco.  Continue to use good perineal care. Good perineal care includes:  Wiping your perineum from front to back.  Keeping your perineum clean.  Check your cut (incision) daily for increased redness, drainage, swelling, or separation of skin.  Clean your incision gently with soap and water every day, and then pat it dry. If your caregiver says it is okay, leave the incision uncovered. Use a bandage (dressing) if the incision is draining fluid or appears irritated. If the adhesive strips across the incision do not fall off within 7 days, carefully peel them off.  Hug a pillow when coughing or sneezing until your incision is healed. This helps to  relieve pain.  Do not use tampons or douche until your caregiver says it is okay.  Shower, wash your hair, and take tub baths as directed by your caregiver.  Wear a well-fitting bra that provides breast support.  Limit wearing support panties or control-top hose.  Drink enough fluids to keep your urine clear or pale yellow.  Eat high-fiber foods such as whole grain cereals and breads, brown rice, beans, and fresh fruits and vegetables every day. These foods may help prevent or relieve constipation.  Resume activities such as climbing stairs, driving, lifting, exercising, or traveling as directed by your caregiver.  Talk to your caregiver about resuming sexual activities. This is dependent upon your risk of infection, your rate of healing, and your comfort and desire to resume sexual activity.  Try to have someone help you with your household activities and your newborn for at least a few days after you leave the hospital.  Rest as much as possible. Try to rest or take a nap when your newborn is sleeping.  Increase your activities gradually.  Keep all of your scheduled postpartum appointments. It is very important to keep your scheduled follow-up appointments. At these appointments, your caregiver will be checking to make sure that you are healing physically and emotionally. SEEK MEDICAL CARE IF:   You are passing large clots from your vagina. Save any clots to show your caregiver.  You have a foul smelling discharge from your vagina.  You have trouble urinating.  You are urinating frequently.  You have pain when you urinate.  You have a change in your bowel movements.  You have increasing redness, pain, or swelling near your incision.  You have pus draining from your incision.  Your incision is separating.  You have painful, hard, or reddened breasts.  You have a severe headache.  You have blurred vision or see spots.  You feel sad or depressed.  You have thoughts  of hurting yourself or your newborn.  You have questions about your care, the care of your newborn, or medicines.  You are dizzy or lightheaded.  You have a rash.  You have pain, redness, or swelling at the site of the removed intravenous access (IV) tube.  You have nausea or vomiting.  You stopped breastfeeding and have not had a menstrual period within 12 weeks of stopping.  You are not breastfeeding and have not had a menstrual period within 12 weeks of delivery.  You have a fever. SEEK IMMEDIATE MEDICAL CARE IF:  You have persistent pain.  You have chest pain.  You have shortness of breath.  You faint.  You have leg pain.  You have stomach pain.  Your vaginal bleeding saturates 2 or more sanitary pads in 1 hour. MAKE SURE YOU:   Understand these instructions.  Will watch your condition.  Will get help right away if you are not doing well or get worse. Document Released: 01/31/2002 Document Revised: 02/03/2012 Document Reviewed: 01/06/2012 [facility] Patient Information 2014 [place].   Postpartum Depression and Baby Blues  The postpartum period begins right after the birth of a baby. During this time, there is often a great amount of joy and excitement. It is also a time of considerable changes in the life of the parent(s). Regardless of how many times a mother gives birth, each child brings new challenges and dynamics to the family. It is not unusual to have feelings of excitement accompanied by confusing shifts in moods, emotions, and thoughts. All mothers are at risk of developing postpartum depression or the "baby blues." These mood changes can occur right after giving birth, or they may occur many months after giving birth. The baby blues or postpartum depression can be mild or severe. Additionally, postpartum depression can resolve rather quickly, or it can be a long-term condition. CAUSES Elevated hormones and their rapid decline are thought to be a main  cause of postpartum depression and the baby blues. There are a number of hormones that radically change during and after pregnancy. Estrogen and progesterone usually decrease immediately after delivering your baby. The level of thyroid hormone and various cortisol steroids also rapidly drop. Other factors that play a major role in these changes include major life events and genetics.  RISK FACTORS If you have any of the following risks for the baby blues or postpartum depression, know what symptoms to watch out for during the postpartum period. Risk factors that may increase the likelihood of getting the baby blues or postpartum depression include:  Havinga personal or family history of depression.  Having depression while being pregnant.  Having premenstrual or oral contraceptive-associated mood issues.  Having exceptional life stress.  Having marital conflict.  Lacking a social support network.  Having a baby with special needs.  Having health problems such as diabetes. SYMPTOMS Baby blues symptoms include:  Brief fluctuations in mood, such as going from extreme happiness to sadness.  Decreased concentration.  Difficulty sleeping.  Crying spells, tearfulness.  Irritability.  Anxiety. Postpartum depression symptoms typically begin within the first month after giving birth. These symptoms include:  Difficulty sleeping or excessive sleepiness.  Marked weight loss.  Agitation.  Feelings of worthlessness.  Lack of interest in activity or food. Postpartum psychosis is a very concerning condition and can be dangerous. Fortunately, it is rare. Displaying any of the following symptoms is cause for immediate medical attention. Postpartum psychosis symptoms include:  Hallucinations and delusions.  Bizarre or disorganized  behavior.  Confusion or disorientation. DIAGNOSIS  A diagnosis is made by an evaluation of your symptoms. There are no medical or lab tests that lead to a  diagnosis, but there are various questionnaires that a caregiver may use to identify those with the baby blues, postpartum depression, or psychosis. Often times, a screening tool called the [place] Postnatal Depression Scale is used to diagnose depression in the postpartum period.  TREATMENT The baby blues usually goes away on its own in 1 to 2 weeks. Social support is often all that is needed. You should be encouraged to get adequate sleep and rest. Occasionally, you may be given medicines to help you sleep.  Postpartum depression requires treatment as it can last several months or longer if it is not treated. Treatment may include individual or group therapy, medicine, or both to address any social, physiological, and psychological factors that may play a role in the depression. Regular exercise, a healthy diet, rest, and social support may also be strongly recommended.  Postpartum psychosis is more serious and needs treatment right away. Hospitalization is often needed. HOME CARE INSTRUCTIONS  Get as much rest as you can. Nap when the baby sleeps.  Exercise regularly. Some women find yoga and walking to be beneficial.  Eat a balanced and nourishing diet.  Do little things that you enjoy. Have a cup of tea, take a bubble bath, read your favorite magazine, or listen to your favorite music.  Avoid alcohol.  Ask for help with household chores, cooking, grocery shopping, or running errands as needed. Do not try to do everything.  Talk to people close to you about how you are feeling. Get support from your partner, family members, friends, or other new moms.  Try to stay positive in how you think. Think about the things you are grateful for.  Do not spend a lot of time alone.  Only take medicine as directed by your caregiver.  Keep all your postpartum appointments.  Let your caregiver know if you have any concerns. SEEK MEDICAL CARE IF: You are having a reaction or problems with your  medicine. SEEK IMMEDIATE MEDICAL CARE IF:  You have suicidal feelings.  You feel you may harm the baby or someone else. Document Released: 02/13/2004 Document Revised: 08/03/2011 Document Reviewed: 03/17/2011 [facility] Patient Information 2014 [place], [place].

## 2014-01-06 LAB — COMPREHENSIVE METABOLIC PANEL
ALT: 19 U/L (ref 0–35)
AST: 33 U/L (ref 0–37)
Albumin: 2.1 g/dL — ABNORMAL LOW (ref 3.5–5.2)
Alkaline Phosphatase: 135 U/L — ABNORMAL HIGH (ref 39–117)
Anion gap: 11 (ref 5–15)
BUN: 12 mg/dL (ref 6–23)
CHLORIDE: 104 meq/L (ref 96–112)
CO2: 23 meq/L (ref 19–32)
Calcium: 8.6 mg/dL (ref 8.4–10.5)
Creatinine, Ser: 1 mg/dL (ref 0.50–1.10)
GFR, EST AFRICAN AMERICAN: 82 mL/min — AB (ref >90)
GFR, EST NON AFRICAN AMERICAN: 71 mL/min — AB (ref >90)
GLUCOSE: 107 mg/dL — AB (ref 70–99)
Potassium: 4.1 mEq/L (ref 3.7–5.3)
SODIUM: 138 meq/L (ref 137–147)
Total Bilirubin: <0.2 mg/dL — ABNORMAL LOW (ref 0.3–1.2)
Total Protein: 5.1 g/dL — ABNORMAL LOW (ref 6.0–8.3)

## 2014-01-06 LAB — CBC
HCT: 27.7 % — ABNORMAL LOW (ref 36.0–46.0)
HEMOGLOBIN: 9 g/dL — AB (ref 12.0–15.0)
MCH: 29.1 pg (ref 26.0–34.0)
MCHC: 32.5 g/dL (ref 30.0–36.0)
MCV: 89.6 fL (ref 78.0–100.0)
PLATELETS: 211 10*3/uL (ref 150–400)
RBC: 3.09 MIL/uL — ABNORMAL LOW (ref 3.87–5.11)
RDW: 14.7 % (ref 11.5–15.5)
WBC: 12.7 10*3/uL — AB (ref 4.0–10.5)

## 2014-01-06 LAB — PROTEIN / CREATININE RATIO, URINE
Creatinine, Urine: 117.83 mg/dL
PROTEIN CREATININE RATIO: 0.11 (ref 0.00–0.15)
Total Protein, Urine: 12.4 mg/dL

## 2014-01-06 LAB — URIC ACID: Uric Acid, Serum: 6.6 mg/dL (ref 2.4–7.0)

## 2014-01-06 LAB — LACTATE DEHYDROGENASE: LDH: 241 U/L (ref 94–250)

## 2014-01-06 MED ORDER — ENOXAPARIN SODIUM 40 MG/0.4ML ~~LOC~~ SOLN: 40.0000 mg | SUBCUTANEOUS | Status: DC

## 2014-01-06 MED ORDER — OXYCODONE-ACETAMINOPHEN 5-325 MG PO TABS: 1.0000 | ORAL_TABLET | ORAL | Status: DC | PRN

## 2014-01-06 MED ORDER — FERROUS SULFATE 325 (65 FE) MG PO TABS: 325.0000 mg | ORAL_TABLET | Freq: Two times a day (BID) | ORAL | Status: DC

## 2014-01-06 NOTE — Lactation Note (Signed)
This note was copied from the chart of [place]. Lactation Consultation Note: Follow up visit with mom before DC. Mom reports that baby cluster fed through the night. He just finished feeding. Now asleep in mom's arms. Reports some pain when baby first latches on but eases off after a few minutes. Nipples intact. Encouraged to rub EBM into nipples after nursing. Asking about lanolin. Has had several yeast infections- encouraged not to use lanolin. Used DEBP only once since he was nursing so often. Reports breasts are feeling a little heavier this morning. No questions at present. Reviewed BFSG and OP appointments as resources for support after DC. To call prn  Patient Name: Kara Duffy [id number] Date: 01/06/2014 Reason for consult: Follow-up assessment   Maternal Data Formula Feeding for Exclusion: No  Feeding   LATCH Score/Interventions  Lactation Tools Discussed/Used     Consult Status Consult Status: Complete    [name] 01/06/2014, 8:42 AM

## 2014-01-10 NOTE — Progress Notes (Signed)
Post discharge chart review completed.  

## 2014-01-12 ENCOUNTER — Telehealth: Payer: Self-pay | Admitting: *Deleted

## 2014-01-12 ENCOUNTER — Other Ambulatory Visit: Payer: Commercial Indemnity

## 2014-01-12 NOTE — Telephone Encounter (Signed)
Called patient back in regards to taking ibuprofen for c-section pain. Dr Alvy Bimler wants her to take Tylenol 1000mg  every 6 hours on schedule. Limit ibuprofen to 400mg  in between . This will help decrease bleeding and bruising. To call if has questions

## 2014-01-19 ENCOUNTER — Encounter (HOSPITAL_COMMUNITY): Payer: Self-pay | Admitting: *Deleted

## 2014-01-19 ENCOUNTER — Inpatient Hospital Stay (HOSPITAL_COMMUNITY)
Admission: AD | Admit: 2014-01-19 | Discharge: 2014-01-19 | Disposition: A | Discharge status: Home / Self Care | Payer: Managed Care, Other (non HMO) | Source: Ambulatory Visit | Attending: Obstetrics and Gynecology | Admitting: Obstetrics and Gynecology

## 2014-01-19 ENCOUNTER — Other Ambulatory Visit: Payer: Commercial Indemnity

## 2014-01-19 DIAGNOSIS — Z98891 History of uterine scar from previous surgery: Secondary | ICD-10-CM

## 2014-01-19 DIAGNOSIS — O99893 Other specified diseases and conditions complicating puerperium: Secondary | ICD-10-CM | POA: Diagnosis present

## 2014-01-19 DIAGNOSIS — R030 Elevated blood-pressure reading, without diagnosis of hypertension: Secondary | ICD-10-CM | POA: Insufficient documentation

## 2014-01-19 DIAGNOSIS — O99891 Other specified diseases and conditions complicating pregnancy: Secondary | ICD-10-CM | POA: Insufficient documentation

## 2014-01-19 DIAGNOSIS — O9989 Other specified diseases and conditions complicating pregnancy, childbirth and the puerperium: Principal | ICD-10-CM

## 2014-01-19 LAB — COMPREHENSIVE METABOLIC PANEL
ALK PHOS: 134 U/L — AB (ref 39–117)
ALT: 28 U/L (ref 0–35)
AST: 28 U/L (ref 0–37)
Albumin: 3.6 g/dL (ref 3.5–5.2)
Anion gap: 11 (ref 5–15)
BUN: 12 mg/dL (ref 6–23)
CO2: 26 meq/L (ref 19–32)
Calcium: 9.1 mg/dL (ref 8.4–10.5)
Chloride: 102 mEq/L (ref 96–112)
Creatinine, Ser: 0.77 mg/dL (ref 0.50–1.10)
GFR calc non Af Amer: >90 mL/min (ref >90)
GLUCOSE: 98 mg/dL (ref 70–99)
Potassium: 4.3 mEq/L (ref 3.7–5.3)
SODIUM: 139 meq/L (ref 137–147)
Total Bilirubin: 0.4 mg/dL (ref 0.3–1.2)
Total Protein: 6.9 g/dL (ref 6.0–8.3)

## 2014-01-19 LAB — CBC WITH DIFFERENTIAL/PLATELET
Basophils Absolute: 0 10*3/uL (ref 0.0–0.1)
Basophils Relative: 1 % (ref 0–1)
EOS PCT: 4 % (ref 0–5)
Eosinophils Absolute: 0.3 10*3/uL (ref 0.0–0.7)
HEMATOCRIT: 38.1 % (ref 36.0–46.0)
HEMOGLOBIN: 12.5 g/dL (ref 12.0–15.0)
LYMPHS ABS: 2.7 10*3/uL (ref 0.7–4.0)
LYMPHS PCT: 36 % (ref 12–46)
MCH: 29.5 pg (ref 26.0–34.0)
MCHC: 32.8 g/dL (ref 30.0–36.0)
MCV: 89.9 fL (ref 78.0–100.0)
MONOS PCT: 5 % (ref 3–12)
Monocytes Absolute: 0.4 10*3/uL (ref 0.1–1.0)
Neutro Abs: 4.1 10*3/uL (ref 1.7–7.7)
Neutrophils Relative %: 54 % (ref 43–77)
PLATELETS: 344 10*3/uL (ref 150–400)
RBC: 4.24 MIL/uL (ref 3.87–5.11)
RDW: 14.5 % (ref 11.5–15.5)
WBC: 7.5 10*3/uL (ref 4.0–10.5)

## 2014-01-19 LAB — URINALYSIS, ROUTINE W REFLEX MICROSCOPIC
Bilirubin Urine: NEGATIVE
GLUCOSE, UA: NEGATIVE mg/dL
Ketones, ur: NEGATIVE mg/dL
LEUKOCYTES UA: NEGATIVE
Nitrite: NEGATIVE
PH: 6.5 (ref 5.0–8.0)
Protein, ur: NEGATIVE mg/dL
SPECIFIC GRAVITY, URINE: 1.01 (ref 1.005–1.030)
Urobilinogen, UA: 0.2 mg/dL (ref 0.0–1.0)

## 2014-01-19 LAB — URINE MICROSCOPIC-ADD ON

## 2014-01-19 LAB — LACTATE DEHYDROGENASE: LDH: 238 U/L (ref 94–250)

## 2014-01-19 LAB — URIC ACID: URIC ACID, SERUM: 5.1 mg/dL (ref 2.4–7.0)

## 2014-01-19 LAB — PROTEIN / CREATININE RATIO, URINE
Creatinine, Urine: 142.53 mg/dL
Protein Creatinine Ratio: 0.13 (ref 0.00–0.15)
Total Protein, Urine: 18.3 mg/dL

## 2014-01-19 MED ORDER — LABETALOL HCL 100 MG PO TABS
100.0000 mg | ORAL_TABLET | Freq: Two times a day (BID) | ORAL | Status: DC
  Administered 2014-01-19: 100 mg via ORAL
  Filled 2014-01-19: qty 1

## 2014-01-19 MED ORDER — LABETALOL HCL 100 MG PO TABS: 100.0000 mg | ORAL_TABLET | Freq: Two times a day (BID) | ORAL | Status: DC

## 2014-01-19 NOTE — MAU Note (Signed)
HA's since Wednesday, some blurred vision today, denies epigastric pain.

## 2014-01-19 NOTE — MAU Provider Note (Signed)
Kara Duffy is a 38 y.o. G2P2 SP CS  For NRFHR on 01/03/14 presents to MAU from the office with HTN.  She reports a HA since last night and vision changed x1 30 minutes ago but denies upper gastric pain.  She reports the last few night she has woke up in a panic fearing the baby is smothered under the covers. Denies all otherleves of anxiety   History     Patient Active Problem List   Diagnosis Date Noted  . S/P cesarean section 01/03/2014  . Gestational hypertension 01/02/2014  . Advanced maternal age (AMA) in pregnancy 01/02/2014  . Herpes simplex type II infection 01/02/2014  . Anemia of other chronic disease 12/08/2013  . Leukocytosis, unspecified 12/08/2013  . Endometrioma of ovary 04/19/2012  . Anticoagulated by anticoagulation treatment 12/30/2011    Chief Complaint  Patient presents with  . Hypertension   HPI  OB History   Grav Para Term Preterm Abortions TAB SAB Ect Mult Living   2 1 1       1       Past Medical History  Diagnosis Date  . Herpes simplex without mention of complication   . Abnormal Pap smear     cin-I  . H/O molluscum contagiosum   . H/O chlamydia infection   . No pertinent past medical history   . DVT (deep venous thrombosis) 2013    right leg - no meds currently  . BV (bacterial vaginosis)     2 wks ago  . Headache(784.0)     otc meds  . Endometriosis   . Allergy   . Anxiety   . Clotting disorder   . DVT prophylaxis 07/10/2013  . Pregnancy 07/10/2013    Past Surgical History  Procedure Laterality Date  . Dilation and curettage of uterus  2010  . Bunionectomy  2012  . Laparoscopy  03/30/2012    Procedure: LAPAROSCOPY OPERATIVE;  Surgeon: Delice Lesch, MD;  Location: [facility];  Service: Gynecology;  Laterality: N/A;  . Ovarian cyst removal  03/30/2012    Procedure: OVARIAN CYSTECTOMY;  Surgeon: Delice Lesch, MD;  Location: [facility];  Service: Gynecology;  Laterality: Left;  . Cesarean section N/A 01/03/2014    Procedure:  CESAREAN SECTION;  Surgeon: Alinda Dooms, MD;  Location: [facility];  Service: Obstetrics;  Laterality: N/A;    Family History  Problem Relation Age of Onset  . Hypertension Mother   . Diabetes Mother   . Deep vein thrombosis Mother   . Hypertension Father   . Hyperlipidemia Father   . Diabetes Paternal Uncle   . Diabetes Paternal Grandmother   . Diabetes Paternal Uncle   . Diabetes Cousin     History  Substance Use Topics  . Smoking status: Never Smoker   . Smokeless tobacco: Never Used  . Alcohol Use: Yes     Comment: twice per week     Allergies: No Known Allergies  Prescriptions prior to admission  Medication Sig Dispense Refill  . acetaminophen (TYLENOL) 500 MG tablet Take 1,000 mg by mouth every 6 (six) hours as needed for mild pain.      Marland Kitchen docusate sodium (COLACE) 100 MG capsule Take 100 mg by mouth 2 (two) times daily.      Marland Kitchen enoxaparin (LOVENOX) 40 MG/0.4ML injection Inject 0.4 mLs (40 mg total) into the skin daily.  42 Syringe  0  . FENUGREEK PO Take 2 capsules by mouth daily.      Marland Kitchen  ferrous sulfate 325 (65 FE) MG tablet Take 1 tablet (325 mg total) by mouth 2 (two) times daily with a meal.  60 tablet  2  . Prenat-Fe Carbonyl-FA-Omega 3 (ONE-A-DAY WOMENS PRENATAL 1 PO) Take 1 tablet by mouth daily.       . valACYclovir (VALTREX) 500 MG tablet Take 500 mg by mouth daily.        ROS See HPI above, all other systems are negative  Physical Exam   Blood pressure 144/88, pulse 92, temperature 98.1 F (36.7 C), temperature source Oral, last menstrual period 03/27/2013, unknown if currently breastfeeding.  Physical Exam  Ext:  WNL ABD: Soft, non tender to palpation, no rebound or guarding    ED Course  Assessment: R/o postpartum pre-eclampsia vs htn  Plan:  Labs: CBC, LDH, CMP, Uric Acid, PCR  Consult with Dr. Marena Chancy Verna Hamon, CNM, MSN 01/19/2014. 10:42 AM

## 2014-01-19 NOTE — MAU Provider Note (Signed)
Results for orders placed during the hospital encounter of 01/19/14 (from the past 24 hour(s))  CBC WITH DIFFERENTIAL     Status: None   Collection Time    01/19/14 10:40 AM      Result Value Ref Range   WBC 7.5  4.0 - 10.5 K/uL   RBC 4.24  3.87 - 5.11 MIL/uL   Hemoglobin 12.5  12.0 - 15.0 g/dL   HCT 38.1  36.0 - 46.0 %   MCV 89.9  78.0 - 100.0 fL   MCH 29.5  26.0 - 34.0 pg   MCHC 32.8  30.0 - 36.0 g/dL   RDW 14.5  11.5 - 15.5 %   Platelets 344  150 - 400 K/uL   Neutrophils Relative % 54  43 - 77 %   Neutro Abs 4.1  1.7 - 7.7 K/uL   Lymphocytes Relative 36  12 - 46 %   Lymphs Abs 2.7  0.7 - 4.0 K/uL   Monocytes Relative 5  3 - 12 %   Monocytes Absolute 0.4  0.1 - 1.0 K/uL   Eosinophils Relative 4  0 - 5 %   Eosinophils Absolute 0.3  0.0 - 0.7 K/uL   Basophils Relative 1  0 - 1 %   Basophils Absolute 0.0  0.0 - 0.1 K/uL  COMPREHENSIVE METABOLIC PANEL     Status: Abnormal   Collection Time    01/19/14 10:40 AM      Result Value Ref Range   Sodium 139  137 - 147 mEq/L   Potassium 4.3  3.7 - 5.3 mEq/L   Chloride 102  96 - 112 mEq/L   CO2 26  19 - 32 mEq/L   Glucose, Bld 98  70 - 99 mg/dL   BUN 12  6 - 23 mg/dL   Creatinine, Ser 0.77  0.50 - 1.10 mg/dL   Calcium 9.1  8.4 - 10.5 mg/dL   Total Protein 6.9  6.0 - 8.3 g/dL   Albumin 3.6  3.5 - 5.2 g/dL   AST 28  0 - 37 U/L   ALT 28  0 - 35 U/L   Alkaline Phosphatase 134 (*) 39 - 117 U/L   Total Bilirubin 0.4  0.3 - 1.2 mg/dL   GFR calc non Af Amer >90  >90 mL/min   GFR calc Af Amer >90  >90 mL/min   Anion gap 11  5 - 15  LACTATE DEHYDROGENASE     Status: None   Collection Time    01/19/14 10:40 AM      Result Value Ref Range   LDH 238  94 - 250 U/L  URIC ACID     Status: None   Collection Time    01/19/14 10:40 AM      Result Value Ref Range   Uric Acid, Serum 5.1  2.4 - 7.0 mg/dL  URINALYSIS, ROUTINE W REFLEX MICROSCOPIC     Status: Abnormal   Collection Time    01/19/14 11:19 AM      Result Value Ref Range   Color,  Urine YELLOW  YELLOW   APPearance CLEAR  CLEAR   Specific Gravity, Urine 1.010  1.005 - 1.030   pH 6.5  5.0 - 8.0   Glucose, UA NEGATIVE  NEGATIVE mg/dL   Hgb urine dipstick SMALL (*) NEGATIVE   Bilirubin Urine NEGATIVE  NEGATIVE   Ketones, ur NEGATIVE  NEGATIVE mg/dL   Protein, ur NEGATIVE  NEGATIVE mg/dL   Urobilinogen, UA 0.2  0.0 - 1.0 mg/dL   Nitrite NEGATIVE  NEGATIVE   Leukocytes, UA NEGATIVE  NEGATIVE  URINE MICROSCOPIC-ADD ON     Status: Abnormal   Collection Time    01/19/14 11:19 AM      Result Value Ref Range   Squamous Epithelial / LPF FEW (*) RARE   WBC, UA 0-2  <3 WBC/hpf   RBC / HPF 3-6  <3 RBC/hpf   Bacteria, UA RARE  RARE  PROTEIN / CREATININE RATIO, URINE     Status: None   Collection Time    01/19/14 11:20 AM      Result Value Ref Range   Creatinine, Urine 142.53     Total Protein, Urine 18.3     PROTEIN CREATININE RATIO 0.13  0.00 - 0.15   Filed Vitals:   01/19/14 1115 01/19/14 1220 01/19/14 1230 01/19/14 1245  BP: 141/87 139/81 125/87 124/68  Pulse: 87 73 85 71  Temp:      TempSrc:        Consulted with Dr. Alesia Richards Labetalol 100mg  bid BP in the office next week DC to home     [employer], CNM, MSN 01/19/2014. 12:53 PM

## 2014-01-19 NOTE — MAU Note (Signed)
C/S on 8/12, had elevated BP for the last month of her pregnancy & it has not returned to normal.  Was seen @ [facility] this a.m., BP was 160/90, sent to MAU.

## 2014-01-31 ENCOUNTER — Telehealth: Payer: Self-pay | Admitting: *Deleted

## 2014-01-31 NOTE — Telephone Encounter (Signed)
Pt has 5 Lovenox injections left.  She sees Dr. Alvy Bimler again on 9/21 and will need 8 more injections to get her to that appt..  Pt doesn't want to order more injections than she needs. She hopes they will be d/c'd soon.  Pt delivered her baby on 01/04/14.

## 2014-01-31 NOTE — Telephone Encounter (Signed)
She can stop after she runs out and she can take 81 mg aspirin daily until I see her

## 2014-01-31 NOTE — Telephone Encounter (Signed)
Left VM for pt informing she can stop lovenox after she runs out in 5 days and then start taking baby aspirin 81 mg once daily until she sees Dr. Alvy Bimler on 9/21.  Asked her to please call back if any questions.

## 2014-02-03 ENCOUNTER — Ambulatory Visit (INDEPENDENT_AMBULATORY_CARE_PROVIDER_SITE_OTHER): Payer: Managed Care, Other (non HMO) | Admitting: Family Medicine

## 2014-02-03 VITALS — BP 130/82 | HR 93 | Temp 98.6°F | Resp 16 | Ht 66.5 in | Wt 207.4 lb

## 2014-02-03 DIAGNOSIS — J029 Acute pharyngitis, unspecified: Secondary | ICD-10-CM

## 2014-02-03 LAB — POCT RAPID STREP A (OFFICE): Rapid Strep A Screen: NEGATIVE

## 2014-02-03 MED ORDER — AZITHROMYCIN 250 MG PO TABS: ORAL_TABLET | ORAL | Status: DC

## 2014-02-03 NOTE — Progress Notes (Addendum)
Subjective:  This chart was scribed for Kara Haber, MD by [name], Medial Scribe. This patient was seen in room 1 and the patient's care was started at 10:49 AM.    Patient ID: Kara Duffy, female    DOB: 12/30/1975, 38 y.o.   MRN: [id number]  HPI HPI Comments: [name] is a 38 y.o. female who presents to the [facility] complaining of sore throat and productive cough with green sputum for three days that significantly worsened yesterday afternoon. Last night patient reports treatment Mucinex that allowed her to sleep through the night.   Patient is currently on maternity leave. She is a Freight forwarder at [employer]. Her son is 1 month today. She is currently breast feeding. Patient reports pregnancy induced Hypertension for which she was started on Lovenox in her second trimester. Patient reports history of right DVT and right foot surgery.  Patient Active Problem List   Diagnosis Date Noted   S/P cesarean section 01/03/2014   Gestational hypertension 01/02/2014   Advanced maternal age (AMA) in pregnancy 01/02/2014   Herpes simplex type II infection 01/02/2014   Anemia of other chronic disease 12/08/2013   Leukocytosis, unspecified 12/08/2013   Endometrioma of ovary 04/19/2012   Anticoagulated by anticoagulation treatment 12/30/2011   Past Medical History  Diagnosis Date   Herpes simplex without mention of complication    Abnormal Pap smear     cin-I   H/O molluscum contagiosum    H/O chlamydia infection    No pertinent past medical history    DVT (deep venous thrombosis) 2013    right leg - no meds currently   BV (bacterial vaginosis)     2 wks ago   Headache(784.0)     otc meds   Endometriosis    Allergy    Anxiety    Clotting disorder    DVT prophylaxis 07/10/2013   Pregnancy 07/10/2013   Past Surgical History  Procedure Laterality Date   Dilation and curettage of uterus  2010   Bunionectomy  2012    Laparoscopy  03/30/2012    Procedure: LAPAROSCOPY OPERATIVE;  Surgeon: Delice Lesch, MD;  Location: [facility];  Service: Gynecology;  Laterality: N/A;   Ovarian cyst removal  03/30/2012    Procedure: OVARIAN CYSTECTOMY;  Surgeon: Delice Lesch, MD;  Location: [facility];  Service: Gynecology;  Laterality: Left;   Cesarean section N/A 01/03/2014    Procedure: CESAREAN SECTION;  Surgeon: Alinda Dooms, MD;  Location: [facility];  Service: Obstetrics;  Laterality: N/A;   No Known Allergies Prior to Admission medications   Medication Sig Start Date End Date Taking? Authorizing Provider  acetaminophen (TYLENOL) 500 MG tablet Take 1,000 mg by mouth every 6 (six) hours as needed for mild pain.   Yes Historical Provider, MD  enoxaparin (LOVENOX) 40 MG/0.4ML injection Inject 0.4 mLs (40 mg total) into the skin daily. 01/06/14  Yes Venus Standard, CNM  FENUGREEK PO Take 2 capsules by mouth daily.   Yes Historical Provider, MD  ferrous sulfate 325 (65 FE) MG tablet Take 1 tablet (325 mg total) by mouth 2 (two) times daily with a meal. 01/06/14  Yes Venus Standard, CNM  labetalol (NORMODYNE) 100 MG tablet Take 1 tablet (100 mg total) by mouth 2 (two) times daily. 01/19/14  Yes Venus Standard, CNM  Prenat-Fe Carbonyl-FA-Omega 3 (ONE-A-DAY WOMENS PRENATAL 1 PO) Take 1 tablet by mouth daily.    Yes Historical Provider, MD  Pseudoephedrine-Guaifenesin [facility] D  PO) Take by mouth every 12 (twelve) hours.   Yes Historical Provider, MD  valACYclovir (VALTREX) 500 MG tablet Take 500 mg by mouth daily. 11/10/13  Yes Historical Provider, MD  docusate sodium (COLACE) 100 MG capsule Take 100 mg by mouth 2 (two) times daily.    Historical Provider, MD   History   Social History   Marital Status: Married    Spouse Name: N/A    Number of Children: N/A   Years of Education: N/A   Occupational History   Not on file.   Social History Main Topics   Smoking status: Never Smoker    Smokeless tobacco: Never Used    Alcohol Use: Yes     Comment: twice per week    Drug Use: No   Sexual Activity: Yes    Partners: Male    Birth Control/ Protection: IUD     Comment: paragard 07/20/11   Other Topics Concern   Not on file   Social History Narrative   No narrative on file    Review of Systems  Constitutional: Negative for fever and chills.  HENT: Positive for congestion, rhinorrhea and sore throat.   Respiratory: Positive for cough.        Objective:   Physical Exam  Nursing note and vitals reviewed. Constitutional: She appears well-developed and well-nourished.  HENT:  Head: Atraumatic.  Cardiovascular: Normal rate, regular rhythm and normal heart sounds.   No murmur heard. Pulmonary/Chest: Effort normal. No respiratory distress.     Filed Vitals:   02/03/14 1031  BP: 130/82  Pulse: 93  Temp: 98.6 F (37 C)  TempSrc: Oral  Resp: 16  Height: 5' 6.5" (1.689 m)  Weight: 207 lb 6.4 oz (94.076 kg)  SpO2: 97%   Results for orders placed in visit on 02/03/14  POCT RAPID STREP A (OFFICE)      Result Value Ref Range   Rapid Strep A Screen Negative  Negative        Assessment & Plan:   I personally performed the services described in this documentation, which was scribed in my presence. The recorded information has been reviewed and is accurate.  Sore throat - Plan: POCT rapid strep A, azithromycin (ZITHROMAX Z-PAK) 250 MG tablet  Signed, Kara Haber, MD

## 2014-02-03 NOTE — Patient Instructions (Addendum)
Try mixing up a ginger honey Tea as redescribed with fresh ginger and 4 tablespoons of honey in 1 pint of water   Sore Throat A sore throat is pain, burning, irritation, or scratchiness of the throat. There is often pain or tenderness when swallowing or talking. A sore throat may be accompanied by other symptoms, such as coughing, sneezing, fever, and swollen neck glands. A sore throat is often the first sign of another sickness, such as a cold, flu, strep throat, or mononucleosis (commonly known as mono). Most sore throats go away without medical treatment. CAUSES  The most common causes of a sore throat include:  A viral infection, such as a cold, flu, or mono.  A bacterial infection, such as strep throat, tonsillitis, or whooping cough.  Seasonal allergies.  Dryness in the air.  Irritants, such as smoke or pollution.  Gastroesophageal reflux disease (GERD). HOME CARE INSTRUCTIONS   Only take over-the-counter medicines as directed by your caregiver.  Drink enough fluids to keep your urine clear or pale yellow.  Rest as needed.  Try using throat sprays, lozenges, or sucking on hard candy to ease any pain (if older than 4 years or as directed).  Sip warm liquids, such as broth, herbal tea, or warm water with honey to relieve pain temporarily. You may also eat or drink cold or frozen liquids such as frozen ice pops.  Gargle with salt water (mix 1 tsp salt with 8 oz of water).  Do not smoke and avoid secondhand smoke.  Put a cool-mist humidifier in your bedroom at night to moisten the air. You can also turn on a hot shower and sit in the bathroom with the door closed for 5-10 minutes. SEEK IMMEDIATE MEDICAL CARE IF:  You have difficulty breathing.  You are unable to swallow fluids, soft foods, or your saliva.  You have increased swelling in the throat.  Your sore throat does not get better in 7 days.  You have nausea and vomiting.  You have a fever or persistent symptoms  for more than 2-3 days.  You have a fever and your symptoms suddenly get worse. MAKE SURE YOU:   Understand these instructions.  Will watch your condition.  Will get help right away if you are not doing well or get worse. Document Released: 06/18/2004 Document Revised: 04/27/2012 Document Reviewed: 01/17/2012 [facility] Patient Information 2015 [place], [place]. This information is not intended to replace advice given to you by your health care provider. Make sure you discuss any questions you have with your health care provider.

## 2014-02-12 ENCOUNTER — Ambulatory Visit (HOSPITAL_BASED_OUTPATIENT_CLINIC_OR_DEPARTMENT_OTHER): Payer: Managed Care, Other (non HMO) | Admitting: Hematology and Oncology

## 2014-02-12 ENCOUNTER — Encounter: Payer: Self-pay | Admitting: Hematology and Oncology

## 2014-02-12 ENCOUNTER — Other Ambulatory Visit (HOSPITAL_BASED_OUTPATIENT_CLINIC_OR_DEPARTMENT_OTHER): Payer: Managed Care, Other (non HMO)

## 2014-02-12 VITALS — BP 137/70 | HR 66 | Temp 98.5°F | Resp 18 | Ht 66.5 in | Wt 207.4 lb

## 2014-02-12 DIAGNOSIS — Z86718 Personal history of other venous thrombosis and embolism: Secondary | ICD-10-CM | POA: Insufficient documentation

## 2014-02-12 DIAGNOSIS — IMO0001 Reserved for inherently not codable concepts without codable children: Secondary | ICD-10-CM

## 2014-02-12 DIAGNOSIS — Z299 Encounter for prophylactic measures, unspecified: Secondary | ICD-10-CM

## 2014-02-12 LAB — CBC WITH DIFFERENTIAL/PLATELET
BASO%: 0.6 % (ref 0.0–2.0)
Basophils Absolute: 0.1 10*3/uL (ref 0.0–0.1)
EOS%: 3.6 % (ref 0.0–7.0)
Eosinophils Absolute: 0.3 10*3/uL (ref 0.0–0.5)
HCT: 38.6 % (ref 34.8–46.6)
HGB: 12.3 g/dL (ref 11.6–15.9)
LYMPH#: 3 10*3/uL (ref 0.9–3.3)
LYMPH%: 35.3 % (ref 14.0–49.7)
MCH: 29 pg (ref 25.1–34.0)
MCHC: 31.8 g/dL (ref 31.5–36.0)
MCV: 91.2 fL (ref 79.5–101.0)
MONO#: 0.5 10*3/uL (ref 0.1–0.9)
MONO%: 5.6 % (ref 0.0–14.0)
NEUT#: 4.7 10*3/uL (ref 1.5–6.5)
NEUT%: 54.9 % (ref 38.4–76.8)
Platelets: 274 10*3/uL (ref 145–400)
RBC: 4.23 10*6/uL (ref 3.70–5.45)
RDW: 15.1 % — AB (ref 11.2–14.5)
WBC: 8.6 10*3/uL (ref 3.9–10.3)

## 2014-02-12 NOTE — Progress Notes (Signed)
Kara Duffy OFFICE PROGRESS NOTE  Kara Pel, MD SUMMARY OF HEMATOLOGIC HISTORY:  According to the patient, she undergo went as bunion surgery on her right foot in December of 2012. She was also taking birth control pill around the time of surgery. She started to notice significant right lower extremity swelling and pain. On 07/01/2011, right lower extremity ultrasound showed acute deep vein thrombosis involving the right distal superficial femoral vein, popliteal vein, posterior tibial and peroneal veins. She was anticoagulated for 3 months and repeat ultrasound reportedly was negative. The patient received treatment with Xarelto with no bleeding complications. She underwent thrombophilia screening and was told it was negative.  The patient had been exposed to birth control pills since age 34. She stopped taking them since the diagnosis of blood clot 2 years ago. There is family history of blood clots in her mother. As mentioned above, she was tested for thrombophilia disorder and he came back negative. The patient was started on Lovenox injection on February 16th, 2015 On 12/08/2013, she switch to heparin subcutaneous injection. On 01/03/2014, she delivered her son via C-section  INTERVAL HISTORY: Kara Duffy 38 y.o. female returns for further followup. She has no recent complication of the cesarean section. She started taking aspirin recently. The patient denies any recent signs or symptoms of bleeding such as spontaneous epistaxis, hematuria or hematochezia.   I have reviewed the past medical history, past surgical history, social history and family history with the patient and they are unchanged from previous note.  ALLERGIES:  has No Known Allergies.  MEDICATIONS:  Current Outpatient Prescriptions  Medication Sig Dispense Refill  . acetaminophen (TYLENOL) 500 MG tablet Take 1,000 mg by mouth every 6 (six) hours as needed for mild pain.      Marland Kitchen aspirin  81 MG tablet Take 81 mg by mouth daily.      Marland Kitchen FENUGREEK PO Take 2 capsules by mouth daily.      . ferrous sulfate 325 (65 FE) MG tablet Take 1 tablet (325 mg total) by mouth 2 (two) times daily with a meal.  60 tablet  2  . labetalol (NORMODYNE) 100 MG tablet Take 1 tablet (100 mg total) by mouth 2 (two) times daily.  60 tablet  2  . Prenat-Fe Carbonyl-FA-Omega 3 (ONE-A-DAY WOMENS PRENATAL 1 PO) Take 1 tablet by mouth daily.       . valACYclovir (VALTREX) 500 MG tablet Take 500 mg by mouth daily.       No current facility-administered medications for this visit.     REVIEW OF SYSTEMS:   Constitutional: Denies fevers, chills or night sweats Eyes: Denies blurriness of vision Ears, nose, mouth, throat, and face: Denies mucositis or sore throat Respiratory: Denies cough, dyspnea or wheezes Cardiovascular: Denies palpitation, chest discomfort or lower extremity swelling Gastrointestinal:  Denies nausea, heartburn or change in bowel habits Skin: Denies abnormal skin rashes Lymphatics: Denies new lymphadenopathy or easy bruising Neurological:Denies numbness, tingling or new weaknesses Behavioral/Psych: Mood is stable, no new changes  All other systems were reviewed with the patient and are negative.  PHYSICAL EXAMINATION: ECOG PERFORMANCE STATUS: 0 - Asymptomatic  Filed Vitals:   02/12/14 1252  BP: 137/70  Pulse: 66  Temp: 98.5 F (36.9 C)  Resp: 18   Filed Weights   02/12/14 1252  Weight: 207 lb 6.4 oz (94.076 kg)    GENERAL:alert, no distress and comfortable SKIN: skin color, texture, turgor are normal, no rashes or significant lesions EYES: normal, Conjunctiva  are pink and non-injected, sclera clear Musculoskeletal:no cyanosis of digits and no clubbing  NEURO: alert & oriented x 3 with fluent speech, no focal motor/sensory deficits  LABORATORY DATA:  I have reviewed the data as listed Results for orders placed in visit on 02/12/14 (from the past 48 hour(s))  CBC WITH  DIFFERENTIAL     Status: Abnormal   Collection Time    02/12/14 12:36 PM      Result Value Ref Range   WBC 8.6  3.9 - 10.3 10e3/uL   NEUT# 4.7  1.5 - 6.5 10e3/uL   HGB 12.3  11.6 - 15.9 g/dL   HCT 38.6  34.8 - 46.6 %   Platelets 274  145 - 400 10e3/uL   MCV 91.2  79.5 - 101.0 fL   MCH 29.0  25.1 - 34.0 pg   MCHC 31.8  31.5 - 36.0 g/dL   RBC 4.23  3.70 - 5.45 10e6/uL   RDW 15.1 (*) 11.2 - 14.5 %   lymph# 3.0  0.9 - 3.3 10e3/uL   MONO# 0.5  0.1 - 0.9 10e3/uL   Eosinophils Absolute 0.3  0.0 - 0.5 10e3/uL   Basophils Absolute 0.1  0.0 - 0.1 10e3/uL   NEUT% 54.9  38.4 - 76.8 %   LYMPH% 35.3  14.0 - 49.7 %   MONO% 5.6  0.0 - 14.0 %   EOS% 3.6  0.0 - 7.0 %   BASO% 0.6  0.0 - 2.0 %    Lab Results  Component Value Date   WBC 8.6 02/12/2014   HGB 12.3 02/12/2014   HCT 38.6 02/12/2014   MCV 91.2 02/12/2014   PLT 274 02/12/2014    ASSESSMENT & PLAN:  History of DVT (deep vein thrombosis) Her remote history of DVT appears to be performed. She did well with prophylactic anticoagulation therapy around her pregnancy. I discussed with her the risk and benefit of aspirin 81 mg to prevent risk of recurrence of thrombosis in the future. I have not made return appointment for the patient to come back.    All questions were answered. The patient knows to call the clinic with any problems, questions or concerns. No barriers to learning was detected.  I spent 15 minutes counseling the patient face to face. The total time spent in the appointment was 20 minutes and more than 50% was on counseling.     [employer], [place], MD 02/12/2014 9:10 PM

## 2014-02-12 NOTE — Assessment & Plan Note (Signed)
Her remote history of DVT appears to be performed. She did well with prophylactic anticoagulation therapy around her pregnancy. I discussed with her the risk and benefit of aspirin 81 mg to prevent risk of recurrence of thrombosis in the future. I have not made return appointment for the patient to come back.

## 2014-02-13 ENCOUNTER — Telehealth: Payer: Self-pay | Admitting: *Deleted

## 2014-02-13 NOTE — Telephone Encounter (Signed)
Pt requested labs faxed to Dr. Alesia Richards at Fax [phone].  Labs faxed and pt notified.

## 2014-03-26 ENCOUNTER — Encounter: Payer: Self-pay | Admitting: Hematology and Oncology

## 2014-04-02 ENCOUNTER — Ambulatory Visit (INDEPENDENT_AMBULATORY_CARE_PROVIDER_SITE_OTHER): Payer: Managed Care, Other (non HMO) | Admitting: Family Medicine

## 2014-04-02 VITALS — BP 122/70 | HR 80 | Temp 98.2°F | Resp 18 | Ht 66.5 in | Wt 205.0 lb

## 2014-04-02 DIAGNOSIS — J209 Acute bronchitis, unspecified: Secondary | ICD-10-CM

## 2014-04-02 MED ORDER — AMOXICILLIN 875 MG PO TABS: 875.0000 mg | ORAL_TABLET | Freq: Two times a day (BID) | ORAL | Status: DC

## 2014-04-02 NOTE — Patient Instructions (Signed)
Drink plenty of fluids and get enough rest  Continue Delsym for the cough  Take amoxicillin one twice daily  Return if worse.

## 2014-04-02 NOTE — Progress Notes (Signed)
Subjective: 38 year old lady who's here with history of having a sore throat since Thursday or Friday. This sore throat has mostly subsided, but she has a persistent cough. Brought up a little green mucus this morning. She gets pretty or coughing at times. She did work today, works as a Investment banker, corporate. She does not smoke. She has a 33-month-old child that she is still nursing some.  Objective: Alert and oriented, not terribly ill-looking. She did work today. Her TMs are normal. Throat not erythematous. Neck supple without significant nodes. Chest is clear to auscultation. Heart regular without murmurs.  Assessment: Acute bronchitis, possibly viral versus transitioning into a bacterial infection. Because she is worsening out day 5 I'm going to go ahead and treat her.  Plan: Amoxicillin Due to the breast-feeding she was not given other cough medications. Return as needed no tests were done today.

## 2016-08-26 DIAGNOSIS — J069 Acute upper respiratory infection, unspecified: Secondary | ICD-10-CM | POA: Diagnosis not present

## 2017-02-01 DIAGNOSIS — L309 Dermatitis, unspecified: Secondary | ICD-10-CM | POA: Diagnosis not present

## 2017-02-04 DIAGNOSIS — L42 Pityriasis rosea: Secondary | ICD-10-CM | POA: Diagnosis not present

## 2017-03-22 DIAGNOSIS — Z124 Encounter for screening for malignant neoplasm of cervix: Secondary | ICD-10-CM | POA: Diagnosis not present

## 2017-03-22 DIAGNOSIS — Z6832 Body mass index (BMI) 32.0-32.9, adult: Secondary | ICD-10-CM | POA: Diagnosis not present

## 2017-03-22 DIAGNOSIS — Z1231 Encounter for screening mammogram for malignant neoplasm of breast: Secondary | ICD-10-CM | POA: Diagnosis not present

## 2017-03-22 DIAGNOSIS — R87610 Atypical squamous cells of undetermined significance on cytologic smear of cervix (ASC-US): Secondary | ICD-10-CM | POA: Diagnosis not present

## 2017-03-22 DIAGNOSIS — Z113 Encounter for screening for infections with a predominantly sexual mode of transmission: Secondary | ICD-10-CM | POA: Diagnosis not present

## 2017-03-22 DIAGNOSIS — Z01411 Encounter for gynecological examination (general) (routine) with abnormal findings: Secondary | ICD-10-CM | POA: Diagnosis not present

## 2017-05-06 DIAGNOSIS — Z0000 Encounter for general adult medical examination without abnormal findings: Secondary | ICD-10-CM | POA: Diagnosis not present

## 2017-05-06 DIAGNOSIS — N390 Urinary tract infection, site not specified: Secondary | ICD-10-CM | POA: Diagnosis not present

## 2017-10-22 DIAGNOSIS — Z820 Family history of epilepsy and other diseases of the nervous system: Secondary | ICD-10-CM | POA: Diagnosis not present

## 2017-11-05 NOTE — Telephone Encounter (Signed)
Preadmission screen  

## 2017-11-17 DIAGNOSIS — R5383 Other fatigue: Secondary | ICD-10-CM | POA: Diagnosis not present

## 2017-11-17 DIAGNOSIS — N803 Endometriosis of pelvic peritoneum: Secondary | ICD-10-CM | POA: Diagnosis not present

## 2017-11-17 DIAGNOSIS — R6882 Decreased libido: Secondary | ICD-10-CM | POA: Diagnosis not present

## 2017-11-17 DIAGNOSIS — R109 Unspecified abdominal pain: Secondary | ICD-10-CM | POA: Diagnosis not present

## 2017-12-01 DIAGNOSIS — R102 Pelvic and perineal pain: Secondary | ICD-10-CM | POA: Diagnosis not present

## 2017-12-01 DIAGNOSIS — Z23 Encounter for immunization: Secondary | ICD-10-CM | POA: Diagnosis not present

## 2017-12-01 DIAGNOSIS — R5383 Other fatigue: Secondary | ICD-10-CM | POA: Diagnosis not present

## 2017-12-06 DIAGNOSIS — H1031 Unspecified acute conjunctivitis, right eye: Secondary | ICD-10-CM | POA: Diagnosis not present

## 2017-12-10 DIAGNOSIS — H1031 Unspecified acute conjunctivitis, right eye: Secondary | ICD-10-CM | POA: Diagnosis not present

## 2017-12-10 DIAGNOSIS — R220 Localized swelling, mass and lump, head: Secondary | ICD-10-CM | POA: Diagnosis not present

## 2017-12-12 DIAGNOSIS — H109 Unspecified conjunctivitis: Secondary | ICD-10-CM | POA: Diagnosis not present

## 2017-12-17 DIAGNOSIS — Z760 Encounter for issue of repeat prescription: Secondary | ICD-10-CM | POA: Diagnosis not present

## 2017-12-17 DIAGNOSIS — R030 Elevated blood-pressure reading, without diagnosis of hypertension: Secondary | ICD-10-CM | POA: Diagnosis not present

## 2017-12-20 DIAGNOSIS — N800 Endometriosis of uterus: Secondary | ICD-10-CM | POA: Diagnosis not present

## 2017-12-20 DIAGNOSIS — R102 Pelvic and perineal pain: Secondary | ICD-10-CM | POA: Diagnosis not present

## 2017-12-20 DIAGNOSIS — N809 Endometriosis, unspecified: Secondary | ICD-10-CM | POA: Diagnosis not present

## 2017-12-20 DIAGNOSIS — D259 Leiomyoma of uterus, unspecified: Secondary | ICD-10-CM | POA: Diagnosis not present

## 2018-02-01 DIAGNOSIS — Z23 Encounter for immunization: Secondary | ICD-10-CM | POA: Diagnosis not present

## 2018-02-04 DIAGNOSIS — D259 Leiomyoma of uterus, unspecified: Secondary | ICD-10-CM | POA: Diagnosis not present

## 2018-02-04 DIAGNOSIS — N800 Endometriosis of uterus: Secondary | ICD-10-CM | POA: Diagnosis not present

## 2018-02-04 DIAGNOSIS — N841 Polyp of cervix uteri: Secondary | ICD-10-CM | POA: Diagnosis not present

## 2018-02-04 DIAGNOSIS — N809 Endometriosis, unspecified: Secondary | ICD-10-CM | POA: Diagnosis not present

## 2018-02-09 ENCOUNTER — Other Ambulatory Visit: Payer: Self-pay | Admitting: Obstetrics and Gynecology

## 2018-03-14 DIAGNOSIS — M79661 Pain in right lower leg: Secondary | ICD-10-CM | POA: Diagnosis not present

## 2018-03-14 DIAGNOSIS — M21961 Unspecified acquired deformity of right lower leg: Secondary | ICD-10-CM | POA: Diagnosis not present

## 2018-03-17 ENCOUNTER — Encounter (HOSPITAL_BASED_OUTPATIENT_CLINIC_OR_DEPARTMENT_OTHER): Payer: Self-pay | Admitting: *Deleted

## 2018-03-17 DIAGNOSIS — Z0389 Encounter for observation for other suspected diseases and conditions ruled out: Secondary | ICD-10-CM | POA: Diagnosis not present

## 2018-03-17 DIAGNOSIS — M79661 Pain in right lower leg: Secondary | ICD-10-CM | POA: Diagnosis not present

## 2018-03-24 ENCOUNTER — Other Ambulatory Visit: Payer: Self-pay | Admitting: Obstetrics and Gynecology

## 2018-03-24 DIAGNOSIS — Z113 Encounter for screening for infections with a predominantly sexual mode of transmission: Secondary | ICD-10-CM | POA: Diagnosis not present

## 2018-03-24 DIAGNOSIS — N800 Endometriosis of uterus: Secondary | ICD-10-CM | POA: Diagnosis not present

## 2018-03-24 DIAGNOSIS — Z6833 Body mass index (BMI) 33.0-33.9, adult: Secondary | ICD-10-CM | POA: Diagnosis not present

## 2018-03-24 DIAGNOSIS — Z124 Encounter for screening for malignant neoplasm of cervix: Secondary | ICD-10-CM | POA: Diagnosis not present

## 2018-03-24 DIAGNOSIS — D259 Leiomyoma of uterus, unspecified: Secondary | ICD-10-CM | POA: Diagnosis not present

## 2018-03-24 DIAGNOSIS — N920 Excessive and frequent menstruation with regular cycle: Secondary | ICD-10-CM | POA: Diagnosis not present

## 2018-03-24 DIAGNOSIS — Z01411 Encounter for gynecological examination (general) (routine) with abnormal findings: Secondary | ICD-10-CM | POA: Diagnosis not present

## 2018-03-24 DIAGNOSIS — Z1231 Encounter for screening mammogram for malignant neoplasm of breast: Secondary | ICD-10-CM | POA: Diagnosis not present

## 2018-03-24 DIAGNOSIS — N840 Polyp of corpus uteri: Secondary | ICD-10-CM | POA: Diagnosis not present

## 2018-04-05 NOTE — Patient Instructions (Signed)
Your procedure is scheduled on  04-13-18  Report to [facility] AT 1000 AM   Call this number if you have problems the morning of surgery  :[phone].   OUR ADDRESS IS [address].  WE ARE LOCATED IN THE [place] [place]                                   [facility].                                     REMEMBER:  DO NOT EAT FOOD OR DRINK LIQUIDS AFTER MIDNIGHT .  TAKE THESE MEDICATIONS MORNING OF SURGERY WITH A SIP OF WATER:  CERTRIZINE (ZYRTEC), VALTREX  IF YOU ARE SPENDING THE NIGHT AFTER SURGERY PLEASE BRING ALL YOUR PRESCRIPTION MEDICATIONS IN THEIR ORIGINAL BOTTLES.                                    DO NOT WEAR JEWERLY, MAKE UP, OR NAIL POLISH,  DO NOT WEAR LOTIONS, POWDERS, PERFUMES OR DEODORANT. DO NOT SHAVE FOR 24 HOURS PRIOR TO DAY OF SURGERY. MEN MAY SHAVE FACE AND NECK. CONTACTS, GLASSES, OR DENTURES MAY NOT BE WORN TO SURGERY.                                    [facility] IS NOT RESPONSIBLE  FOR ANY BELONGINGS.                                                                    Marland Kitchen                                                                                                    [facility] - Preparing for Surgery Before surgery, you can play an important role.  Because skin is not sterile, your skin needs to be as free of germs as possible.  You can reduce the number of germs on your skin by washing with CHG (chlorahexidine gluconate) soap before surgery.  CHG is an antiseptic cleaner which kills germs and bonds with the skin to continue killing germs even after washing. Please DO NOT use if you have an allergy to CHG or antibacterial soaps.  If your skin becomes reddened/irritated stop using the CHG and inform your nurse when you arrive at Short Stay. Do not shave (including legs and underarms) for at least 48 hours prior to the first CHG shower.  You may shave your face/neck. Please follow these instructions carefully:  1.  Shower with CHG  Soap the  night before surgery and the  morning of Surgery.  2.  If you choose to wash your hair, wash your hair first as usual with your  normal  shampoo.  3.  After you shampoo, rinse your hair and body thoroughly to remove the  shampoo.                           4.  Use CHG as you would any other liquid soap.  You can apply chg directly  to the skin and wash                       Gently with a scrungie or clean washcloth.  5.  Apply the CHG Soap to your body ONLY FROM THE NECK DOWN.   Do not use on face/ open                           Wound or open sores. Avoid contact with eyes, ears mouth and genitals (private parts).                       Wash face,  Genitals (private parts) with your normal soap.             6.  Wash thoroughly, paying special attention to the area where your surgery  will be performed.  7.  Thoroughly rinse your body with warm water from the neck down.  8.  DO NOT shower/wash with your normal soap after using and rinsing off  the CHG Soap.                9.  Pat yourself dry with a clean towel.            10.  Wear clean pajamas.            11.  Place clean sheets on your bed the night of your first shower and do not  sleep with pets. Day of Surgery : Do not apply any lotions/deodorants the morning of surgery.  Please wear clean clothes to the hospital/surgery center.  FAILURE TO FOLLOW THESE INSTRUCTIONS MAY RESULT IN THE CANCELLATION OF YOUR SURGERY PATIENT SIGNATURE_________________________________  NURSE SIGNATURE__________________________________  ________________________________________________________________________

## 2018-04-08 ENCOUNTER — Other Ambulatory Visit: Payer: Self-pay

## 2018-04-08 ENCOUNTER — Encounter (HOSPITAL_COMMUNITY)
Admission: RE | Admit: 2018-04-08 | Discharge: 2018-04-08 | Disposition: A | Discharge status: Home / Self Care | Payer: BLUE CROSS/BLUE SHIELD | Source: Ambulatory Visit | Attending: Obstetrics and Gynecology | Admitting: Obstetrics and Gynecology

## 2018-04-08 ENCOUNTER — Encounter (HOSPITAL_COMMUNITY): Payer: Self-pay

## 2018-04-08 DIAGNOSIS — N841 Polyp of cervix uteri: Secondary | ICD-10-CM | POA: Diagnosis not present

## 2018-04-08 DIAGNOSIS — N809 Endometriosis, unspecified: Secondary | ICD-10-CM | POA: Diagnosis not present

## 2018-04-08 DIAGNOSIS — D259 Leiomyoma of uterus, unspecified: Secondary | ICD-10-CM | POA: Diagnosis not present

## 2018-04-08 DIAGNOSIS — R102 Pelvic and perineal pain: Secondary | ICD-10-CM | POA: Diagnosis not present

## 2018-04-08 DIAGNOSIS — Z01812 Encounter for preprocedural laboratory examination: Secondary | ICD-10-CM | POA: Insufficient documentation

## 2018-04-08 LAB — CBC
HCT: 34.6 % — ABNORMAL LOW (ref 36.0–46.0)
Hemoglobin: 10.7 g/dL — ABNORMAL LOW (ref 12.0–15.0)
MCH: 27.6 pg (ref 26.0–34.0)
MCHC: 30.9 g/dL (ref 30.0–36.0)
MCV: 89.2 fL (ref 80.0–100.0)
NRBC: 0 % (ref 0.0–0.2)
PLATELETS: 284 10*3/uL (ref 150–400)
RBC: 3.88 MIL/uL (ref 3.87–5.11)
RDW: 13.2 % (ref 11.5–15.5)
WBC: 8 10*3/uL (ref 4.0–10.5)

## 2018-04-12 NOTE — H&P (Addendum)
Admission History and Physical Exam for a Gynecology Patient  Kara Duffy is a 42 y.o. female, G2P1001, who presents for a laparoscopy assisted vaginal hysterectomy and bilateral salpingectomy. She has been followed at the [facility] and Gynecology division of [facility].  The patient complains of pelvic pain and dyspareunia.  An ultrasound showed a 7 cm uterus with a 1.9 cm fibroid.  The patient reports that her menstrual cycles can also be heavy.  Her adnexa appeared normal.  The uterus had the appearance of adenomyosis.  Gonorrhea and Chlamydia test were negative.  Her endometrial biopsy was benign.  Most recent Pap smear showed atypical squamous cells of undetermined significance.  Her high risk HPV test was negative.  The patient has a history of a deep venous thrombosis associated with oral contraceptives.  The patient has had a cesarean section in the past.  The patient has elected for definitive therapy by way of vaginal hysterectomy.  OB History    Gravida  2   Para  1   Term  1   Preterm      AB      Living  1     SAB      TAB      Ectopic      Multiple      Live Births  1           Past Medical History:  Diagnosis Date  . Abnormal Pap smear    cin-I  . Allergy   . BV (bacterial vaginosis)    2 wks ago  . Clotting disorder ([place])   . DVT (deep venous thrombosis) ([place]) 2013   right leg - no meds currently  . DVT prophylaxis 07/10/2013  . Endometriosis   . H/O chlamydia infection   . H/O molluscum contagiosum   . Headache(784.0)    otc meds  . Herpes simplex without mention of complication   . No pertinent past medical history   . Pregnancy 07/10/2013    Medication:  Valacyclovir Antihypertensive medications  Past Surgical History:  Procedure Laterality Date  . BUNIONECTOMY  2012  . CESAREAN SECTION N/A 01/03/2014   Procedure: CESAREAN SECTION;  Surgeon: Alinda Dooms, MD;  Location: [facility];  Service:  Obstetrics;  Laterality: N/A;  . DILATION AND CURETTAGE OF UTERUS  2010  . LAPAROSCOPY  03/30/2012   Procedure: LAPAROSCOPY OPERATIVE;  Surgeon: Delice Lesch, MD;  Location: [facility];  Service: Gynecology;  Laterality: N/A;  . OVARIAN CYST REMOVAL  03/30/2012   Procedure: OVARIAN CYSTECTOMY;  Surgeon: Delice Lesch, MD;  Location: [facility];  Service: Gynecology;  Laterality: Left;    No Known Allergies  Family History: family history includes Deep vein thrombosis in her mother; Diabetes in her cousin, mother, paternal grandmother, paternal uncle, and paternal uncle; Hyperlipidemia in her father; Hypertension in her father and mother.  Social History:  reports that she has never smoked. She has never used smokeless tobacco. She reports that she drinks alcohol. She reports that she does not use drugs.  Review of systems: See HPI.  Admission Physical Exam:  BMI equals 33.9  HEENT:                 Within normal limits Chest:                   Clear Heart:  Regular rate and rhythm Breasts:                No masses, skin changes, bleeding, or discharge present Abdomen:             Nontender, no masses Extremities:          Grossly normal Neurologic exam: Grossly normal  Pelvic exam:  External genitalia: normal general appearance Vaginal: normal without tenderness, induration or masses Cervix: normal appearance Adnexa: normal bimanual exam Uterus: Normal size shape and consistency   Preoperative hemoglobin equals 10.1  Assessment:  Pelvic pain Dyspareunia Fibroid uterus Possible adenomyosis Hypertension History of a deep venous thrombosis Prior cesarean section  Plan:  The patient will undergo a laparoscopy assisted vaginal hysterectomy.  She will also have a bilateral salpingectomy.  The patient understands the indications for surgical procedure.  She accepts the risk of, but not limited to, anesthetic complications, bleeding, infections, and possible  damage to the surrounding organs.  She understands that no guarantees can be given concerning the total relief of her pelvic pain.   Eli Hose 04/12/2018

## 2018-04-13 ENCOUNTER — Encounter (HOSPITAL_BASED_OUTPATIENT_CLINIC_OR_DEPARTMENT_OTHER): Payer: Self-pay | Admitting: *Deleted

## 2018-04-13 ENCOUNTER — Other Ambulatory Visit: Payer: Self-pay

## 2018-04-13 ENCOUNTER — Ambulatory Visit (HOSPITAL_BASED_OUTPATIENT_CLINIC_OR_DEPARTMENT_OTHER): Payer: BLUE CROSS/BLUE SHIELD | Admitting: Anesthesiology

## 2018-04-13 ENCOUNTER — Observation Stay (HOSPITAL_BASED_OUTPATIENT_CLINIC_OR_DEPARTMENT_OTHER)
Admission: RE | Admit: 2018-04-13 | Discharge: 2018-04-14 | Disposition: A | Discharge status: Home / Self Care | Payer: BLUE CROSS/BLUE SHIELD | Source: Ambulatory Visit | Attending: Obstetrics and Gynecology | Admitting: Obstetrics and Gynecology

## 2018-04-13 ENCOUNTER — Encounter (HOSPITAL_COMMUNITY)
Admission: RE | Disposition: A | Discharge status: Home / Self Care | Payer: Self-pay | Source: Ambulatory Visit | Attending: Obstetrics and Gynecology

## 2018-04-13 DIAGNOSIS — N800 Endometriosis of uterus: Secondary | ICD-10-CM | POA: Diagnosis not present

## 2018-04-13 DIAGNOSIS — N920 Excessive and frequent menstruation with regular cycle: Secondary | ICD-10-CM | POA: Insufficient documentation

## 2018-04-13 DIAGNOSIS — N841 Polyp of cervix uteri: Secondary | ICD-10-CM | POA: Insufficient documentation

## 2018-04-13 DIAGNOSIS — R102 Pelvic and perineal pain: Secondary | ICD-10-CM | POA: Insufficient documentation

## 2018-04-13 DIAGNOSIS — Z86718 Personal history of other venous thrombosis and embolism: Secondary | ICD-10-CM | POA: Diagnosis not present

## 2018-04-13 DIAGNOSIS — Z79899 Other long term (current) drug therapy: Secondary | ICD-10-CM | POA: Diagnosis not present

## 2018-04-13 DIAGNOSIS — D649 Anemia, unspecified: Secondary | ICD-10-CM | POA: Diagnosis not present

## 2018-04-13 DIAGNOSIS — R51 Headache: Secondary | ICD-10-CM | POA: Insufficient documentation

## 2018-04-13 DIAGNOSIS — D259 Leiomyoma of uterus, unspecified: Secondary | ICD-10-CM | POA: Insufficient documentation

## 2018-04-13 DIAGNOSIS — I10 Essential (primary) hypertension: Secondary | ICD-10-CM | POA: Diagnosis not present

## 2018-04-13 DIAGNOSIS — G8929 Other chronic pain: Secondary | ICD-10-CM | POA: Diagnosis present

## 2018-04-13 HISTORY — PX: LAPAROSCOPIC VAGINAL HYSTERECTOMY WITH SALPINGECTOMY: SHX6680

## 2018-04-13 LAB — POCT PREGNANCY, URINE: Preg Test, Ur: NEGATIVE

## 2018-04-13 SURGERY — HYSTERECTOMY, VAGINAL, LAPAROSCOPY-ASSISTED, WITH SALPINGECTOMY
Anesthesia: General | Site: Abdomen | Laterality: Bilateral

## 2018-04-13 MED ORDER — ONDANSETRON HCL 4 MG PO TABS: 4.0000 mg | ORAL_TABLET | Freq: Three times a day (TID) | ORAL | Status: DC | PRN

## 2018-04-13 MED ORDER — HEPARIN SODIUM (PORCINE) 5000 UNIT/ML IJ SOLN
5000.0000 [IU] | Freq: Three times a day (TID) | INTRAMUSCULAR | Status: DC
  Administered 2018-04-13: 5000 [IU] via SUBCUTANEOUS
  Filled 2018-04-13: qty 1

## 2018-04-13 MED ORDER — FENTANYL CITRATE (PF) 100 MCG/2ML IJ SOLN
25.0000 ug | INTRAMUSCULAR | Status: DC | PRN
  Administered 2018-04-13 (×3): 50 ug via INTRAVENOUS
  Filled 2018-04-13: qty 1

## 2018-04-13 MED ORDER — CEFAZOLIN SODIUM-DEXTROSE 2-4 GM/100ML-% IV SOLN
2.0000 g | INTRAVENOUS | Status: AC
  Administered 2018-04-13: 2 g via INTRAVENOUS
  Filled 2018-04-13: qty 100

## 2018-04-13 MED ORDER — FENTANYL CITRATE (PF) 100 MCG/2ML IJ SOLN
INTRAMUSCULAR | Status: AC
  Filled 2018-04-13: qty 2

## 2018-04-13 MED ORDER — KETOROLAC TROMETHAMINE 30 MG/ML IJ SOLN
INTRAMUSCULAR | Status: DC | PRN
  Administered 2018-04-13: 30 mg via INTRAVENOUS
  Administered 2018-04-13: 30 mg via INTRAMUSCULAR

## 2018-04-13 MED ORDER — ONDANSETRON HCL 4 MG/2ML IJ SOLN
INTRAMUSCULAR | Status: DC | PRN
  Administered 2018-04-13 (×2): 4 mg via INTRAVENOUS

## 2018-04-13 MED ORDER — ARTIFICIAL TEARS OPHTHALMIC OINT
TOPICAL_OINTMENT | OPHTHALMIC | Status: AC
  Filled 2018-04-13: qty 3.5

## 2018-04-13 MED ORDER — HYDROMORPHONE BOLUS VIA INFUSION: 1.0000 mg | INTRAVENOUS | Status: DC | PRN

## 2018-04-13 MED ORDER — ACETAMINOPHEN 10 MG/ML IV SOLN
INTRAVENOUS | Status: DC | PRN
  Administered 2018-04-13: 1000 mg via INTRAVENOUS

## 2018-04-13 MED ORDER — DEXAMETHASONE SODIUM PHOSPHATE 10 MG/ML IJ SOLN
INTRAMUSCULAR | Status: DC | PRN
  Administered 2018-04-13: 10 mg via INTRAVENOUS

## 2018-04-13 MED ORDER — HYDROMORPHONE HCL 1 MG/ML IJ SOLN
1.0000 mg | INTRAMUSCULAR | Status: DC | PRN
  Administered 2018-04-13: 1 mg via INTRAVENOUS
  Filled 2018-04-13: qty 1

## 2018-04-13 MED ORDER — VASOPRESSIN 20 UNIT/ML IV SOLN
INTRAVENOUS | Status: AC
  Filled 2018-04-13: qty 1

## 2018-04-13 MED ORDER — LACTATED RINGERS IV SOLN
INTRAVENOUS | Status: DC
  Administered 2018-04-13 – 2018-04-14 (×2): via INTRAVENOUS

## 2018-04-13 MED ORDER — SODIUM CHLORIDE 0.9 % IV SOLN
INTRAVENOUS | Status: DC | PRN
  Administered 2018-04-13: 20 mL
  Administered 2018-04-13: 30 mL

## 2018-04-13 MED ORDER — DEXAMETHASONE SODIUM PHOSPHATE 10 MG/ML IJ SOLN
INTRAMUSCULAR | Status: AC
  Filled 2018-04-13: qty 1

## 2018-04-13 MED ORDER — LACTATED RINGERS IV SOLN
INTRAVENOUS | Status: DC
  Administered 2018-04-13 (×3): via INTRAVENOUS
  Filled 2018-04-13: qty 1000

## 2018-04-13 MED ORDER — KETOROLAC TROMETHAMINE 30 MG/ML IJ SOLN
30.0000 mg | Freq: Four times a day (QID) | INTRAMUSCULAR | Status: DC
  Administered 2018-04-13 – 2018-04-14 (×2): 30 mg via INTRAVENOUS
  Filled 2018-04-13 (×2): qty 1

## 2018-04-13 MED ORDER — OXYCODONE HCL 5 MG/5ML PO SOLN
5.0000 mg | Freq: Once | ORAL | Status: DC | PRN
  Filled 2018-04-13: qty 5

## 2018-04-13 MED ORDER — SUGAMMADEX SODIUM 200 MG/2ML IV SOLN
INTRAVENOUS | Status: AC
  Filled 2018-04-13: qty 2

## 2018-04-13 MED ORDER — VALACYCLOVIR HCL 500 MG PO TABS
500.0000 mg | ORAL_TABLET | Freq: Every day | ORAL | Status: DC
  Administered 2018-04-13: 500 mg via ORAL
  Filled 2018-04-13: qty 1

## 2018-04-13 MED ORDER — DOCUSATE SODIUM 100 MG PO CAPS
100.0000 mg | ORAL_CAPSULE | Freq: Two times a day (BID) | ORAL | Status: DC
  Administered 2018-04-13: 100 mg via ORAL
  Filled 2018-04-13: qty 1

## 2018-04-13 MED ORDER — SCOPOLAMINE 1 MG/3DAYS TD PT72
MEDICATED_PATCH | TRANSDERMAL | Status: DC | PRN
  Administered 2018-04-13: 1 via TRANSDERMAL

## 2018-04-13 MED ORDER — MIDAZOLAM HCL 2 MG/2ML IJ SOLN
INTRAMUSCULAR | Status: DC | PRN
  Administered 2018-04-13: 2 mg via INTRAVENOUS

## 2018-04-13 MED ORDER — ROCURONIUM BROMIDE 10 MG/ML (PF) SYRINGE
PREFILLED_SYRINGE | INTRAVENOUS | Status: DC | PRN
  Administered 2018-04-13: 40 mg via INTRAVENOUS
  Administered 2018-04-13: 20 mg via INTRAVENOUS
  Administered 2018-04-13: 10 mg via INTRAVENOUS

## 2018-04-13 MED ORDER — OXYCODONE-ACETAMINOPHEN 5-325 MG PO TABS
1.0000 | ORAL_TABLET | Freq: Four times a day (QID) | ORAL | Status: DC | PRN
  Administered 2018-04-13: 1 via ORAL
  Administered 2018-04-14: 2 via ORAL
  Filled 2018-04-13 (×2): qty 1
  Filled 2018-04-13: qty 2

## 2018-04-13 MED ORDER — PROPOFOL 10 MG/ML IV BOLUS
INTRAVENOUS | Status: DC | PRN
  Administered 2018-04-13 (×2): 20 mg via INTRAVENOUS
  Administered 2018-04-13: 150 mg via INTRAVENOUS

## 2018-04-13 MED ORDER — HEPARIN SODIUM (PORCINE) 5000 UNIT/ML IJ SOLN
INTRAMUSCULAR | Status: AC
  Filled 2018-04-13: qty 1

## 2018-04-13 MED ORDER — LIDOCAINE 2% (20 MG/ML) 5 ML SYRINGE
INTRAMUSCULAR | Status: DC | PRN
  Administered 2018-04-13: 100 mg via INTRAVENOUS

## 2018-04-13 MED ORDER — BUPIVACAINE LIPOSOME 1.3 % IJ SUSP
INTRAMUSCULAR | Status: AC
  Filled 2018-04-13: qty 20

## 2018-04-13 MED ORDER — OXYCODONE HCL 5 MG PO TABS
5.0000 mg | ORAL_TABLET | Freq: Once | ORAL | Status: DC | PRN
  Filled 2018-04-13: qty 1

## 2018-04-13 MED ORDER — FENTANYL CITRATE (PF) 100 MCG/2ML IJ SOLN
INTRAMUSCULAR | Status: DC | PRN
  Administered 2018-04-13 (×3): 50 ug via INTRAVENOUS

## 2018-04-13 MED ORDER — SUGAMMADEX SODIUM 200 MG/2ML IV SOLN
INTRAVENOUS | Status: DC | PRN
  Administered 2018-04-13: 200 mg via INTRAVENOUS

## 2018-04-13 MED ORDER — LIDOCAINE 2% (20 MG/ML) 5 ML SYRINGE
INTRAMUSCULAR | Status: AC
  Filled 2018-04-13: qty 5

## 2018-04-13 MED ORDER — ACETAMINOPHEN 10 MG/ML IV SOLN
INTRAVENOUS | Status: AC
  Filled 2018-04-13: qty 100

## 2018-04-13 MED ORDER — ROCURONIUM BROMIDE 10 MG/ML (PF) SYRINGE
PREFILLED_SYRINGE | INTRAVENOUS | Status: AC
  Filled 2018-04-13: qty 10

## 2018-04-13 MED ORDER — IBUPROFEN 400 MG PO TABS: 600.0000 mg | ORAL_TABLET | Freq: Four times a day (QID) | ORAL | Status: DC | PRN

## 2018-04-13 MED ORDER — VASOPRESSIN 20 UNIT/ML IV SOLN
INTRAVENOUS | Status: DC | PRN
  Administered 2018-04-13: 30 mL via INTRAMUSCULAR

## 2018-04-13 MED ORDER — ONDANSETRON HCL 4 MG/2ML IJ SOLN
INTRAMUSCULAR | Status: AC
  Filled 2018-04-13: qty 2

## 2018-04-13 MED ORDER — PROPOFOL 10 MG/ML IV BOLUS
INTRAVENOUS | Status: AC
  Filled 2018-04-13: qty 40

## 2018-04-13 MED ORDER — CEFAZOLIN SODIUM-DEXTROSE 2-4 GM/100ML-% IV SOLN
INTRAVENOUS | Status: AC
  Filled 2018-04-13: qty 100

## 2018-04-13 MED ORDER — SCOPOLAMINE 1 MG/3DAYS TD PT72
MEDICATED_PATCH | TRANSDERMAL | Status: AC
  Filled 2018-04-13: qty 1

## 2018-04-13 MED ORDER — BUPIVACAINE-EPINEPHRINE (PF) 0.25% -1:200000 IJ SOLN
INTRAMUSCULAR | Status: AC
  Filled 2018-04-13: qty 30

## 2018-04-13 MED ORDER — MENTHOL 3 MG MT LOZG: 1.0000 | LOZENGE | OROMUCOSAL | Status: DC | PRN

## 2018-04-13 MED ORDER — IBUPROFEN 400 MG PO TABS: 600.0000 mg | ORAL_TABLET | Freq: Four times a day (QID) | ORAL | Status: DC

## 2018-04-13 MED ORDER — KETOROLAC TROMETHAMINE 30 MG/ML IJ SOLN
INTRAMUSCULAR | Status: AC
  Filled 2018-04-13: qty 2

## 2018-04-13 MED ORDER — FENTANYL CITRATE (PF) 250 MCG/5ML IJ SOLN
INTRAMUSCULAR | Status: AC
  Filled 2018-04-13: qty 5

## 2018-04-13 MED ORDER — HEPARIN SODIUM (PORCINE) 5000 UNIT/ML IJ SOLN
5000.0000 [IU] | Freq: Three times a day (TID) | INTRAMUSCULAR | Status: DC
  Administered 2018-04-13 – 2018-04-14 (×2): 5000 [IU] via SUBCUTANEOUS
  Filled 2018-04-13 (×2): qty 1

## 2018-04-13 MED ORDER — MIDAZOLAM HCL 2 MG/2ML IJ SOLN
INTRAMUSCULAR | Status: AC
  Filled 2018-04-13: qty 2

## 2018-04-13 MED ORDER — ONDANSETRON HCL 4 MG/2ML IJ SOLN
4.0000 mg | Freq: Once | INTRAMUSCULAR | Status: DC | PRN
  Filled 2018-04-13: qty 2

## 2018-04-13 MED ORDER — SODIUM CHLORIDE (PF) 0.9 % IJ SOLN
INTRAMUSCULAR | Status: AC
  Filled 2018-04-13: qty 150

## 2018-04-13 SURGICAL SUPPLY — 55 items
ADH SKN CLS APL DERMABOND .7 (GAUZE/BANDAGES/DRESSINGS) ×1
CABLE HIGH FREQUENCY MONO STRZ (ELECTRODE) IMPLANT
CONT PATH 16OZ SNAP LID 3702 (MISCELLANEOUS) ×3 IMPLANT
CONT SPEC 4OZ CLIKSEAL STRL BL (MISCELLANEOUS) ×2 IMPLANT
COVER BACK TABLE 60X90IN (DRAPES) ×3 IMPLANT
COVER MAYO STAND STRL (DRAPES) ×3 IMPLANT
COVER WAND RF STERILE (DRAPES) ×3 IMPLANT
DECANTER SPIKE VIAL GLASS SM (MISCELLANEOUS) ×18 IMPLANT
DERMABOND ADVANCED (GAUZE/BANDAGES/DRESSINGS) ×2
DERMABOND ADVANCED .7 DNX12 (GAUZE/BANDAGES/DRESSINGS) IMPLANT
DRSG OPSITE POSTOP 3X4 (GAUZE/BANDAGES/DRESSINGS) ×3 IMPLANT
DRSG TELFA 3X8 NADH (GAUZE/BANDAGES/DRESSINGS) ×3 IMPLANT
DURAPREP 26ML APPLICATOR (WOUND CARE) ×3 IMPLANT
ELECT REM PT RETURN 9FT ADLT (ELECTROSURGICAL) ×3
ELECTRODE REM PT RTRN 9FT ADLT (ELECTROSURGICAL) ×1 IMPLANT
GAUZE 4X4 16PLY RFD (DISPOSABLE) ×3 IMPLANT
GAUZE VASELINE 3X9 (GAUZE/BANDAGES/DRESSINGS) IMPLANT
GLOVE BIOGEL PI IND STRL 6.5 (GLOVE) ×1 IMPLANT
GLOVE BIOGEL PI IND STRL 7.0 (GLOVE) ×2 IMPLANT
GLOVE BIOGEL PI IND STRL 8.5 (GLOVE) ×3 IMPLANT
GLOVE BIOGEL PI INDICATOR 6.5 (GLOVE) ×2
GLOVE BIOGEL PI INDICATOR 7.0 (GLOVE) ×4
GLOVE BIOGEL PI INDICATOR 8.5 (GLOVE) ×6
GLOVE ECLIPSE 8.0 STRL XLNG CF (GLOVE) ×13 IMPLANT
NDL MAYO CATGUT SZ4 TPR NDL (NEEDLE) ×1 IMPLANT
NEEDLE MAYO CATGUT SZ4 (NEEDLE) ×3 IMPLANT
NS IRRIG 1000ML POUR BTL (IV SOLUTION) ×3 IMPLANT
PACK LAVH (CUSTOM PROCEDURE TRAY) ×3 IMPLANT
PACK ROBOTIC GOWN (GOWN DISPOSABLE) ×3 IMPLANT
PACK TRENDGUARD 450 HYBRID PRO (MISCELLANEOUS) IMPLANT
PAD DRESSING TELFA 3X8 NADH (GAUZE/BANDAGES/DRESSINGS) ×1 IMPLANT
PENCIL SMOKE EVAC W/HOLSTER (ELECTROSURGICAL) ×3 IMPLANT
PROTECTOR NERVE ULNAR (MISCELLANEOUS) ×6 IMPLANT
SET IRRIG TUBING LAPAROSCOPIC (IRRIGATION / IRRIGATOR) IMPLANT
SOLUTION ELECTROLUBE (MISCELLANEOUS) ×3 IMPLANT
SUT MNCRL AB 3-0 PS2 27 (SUTURE) ×3 IMPLANT
SUT VIC AB 0 CT1 18XCR BRD8 (SUTURE) ×3 IMPLANT
SUT VIC AB 0 CT1 27 (SUTURE) ×9
SUT VIC AB 0 CT1 27XBRD ANBCTR (SUTURE) ×3 IMPLANT
SUT VIC AB 0 CT1 8-18 (SUTURE) ×9
SUT VIC AB 2-0 SH 27 (SUTURE)
SUT VIC AB 2-0 SH 27XBRD (SUTURE) IMPLANT
SUT VICRYL 0 TIES 12 18 (SUTURE) ×3 IMPLANT
SUT VICRYL 0 UR6 27IN ABS (SUTURE) ×5 IMPLANT
SYR 50ML LL SCALE MARK (SYRINGE) IMPLANT
SYR TB 1ML 25GX5/8 (SYRINGE) ×3 IMPLANT
TOWEL OR 17X24 6PK STRL BLUE (TOWEL DISPOSABLE) ×6 IMPLANT
TRAY FOLEY W/BAG SLVR 14FR (SET/KITS/TRAYS/PACK) ×3 IMPLANT
TRENDGUARD 450 HYBRID PRO PACK (MISCELLANEOUS)
TROCAR BALLN 12MMX100 BLUNT (TROCAR) ×2 IMPLANT
TROCAR HASSON GELL 12X100 (TROCAR) IMPLANT
TROCAR XCEL NON-BLD 11X100MML (ENDOMECHANICALS) IMPLANT
TROCAR XCEL NON-BLD 5MMX100MML (ENDOMECHANICALS) ×3 IMPLANT
TROCAR XCEL OPT SLVE 5M 100M (ENDOMECHANICALS) ×3 IMPLANT
WARMER LAPAROSCOPE (MISCELLANEOUS) ×3 IMPLANT

## 2018-04-13 NOTE — Anesthesia Procedure Notes (Signed)
Procedure Name: Intubation Date/Time: 04/13/2018 12:18 PM Performed by: Lidia Collum, MD Pre-anesthesia Checklist: Patient identified, Emergency Drugs available, Suction available and Patient being monitored Patient Re-evaluated:Patient Re-evaluated prior to induction Oxygen Delivery Method: Circle system utilized Preoxygenation: Pre-oxygenation with 100% oxygen Induction Type: IV induction Ventilation: Mask ventilation without difficulty Laryngoscope Size: Mac and 3 Grade View: Grade I Tube type: Oral Tube size: 7.0 mm Number of attempts: 1 Airway Equipment and Method: Stylet and Oral airway Placement Confirmation: ETT inserted through vocal cords under direct vision,  positive ETCO2 and breath sounds checked- equal and bilateral Secured at: 21 cm Tube secured with: Tape Dental Injury: Teeth and Oropharynx as per pre-operative assessment

## 2018-04-13 NOTE — Transfer of Care (Signed)
  Last Vitals:  Vitals Value Taken Time  BP 106/84 04/13/2018  2:45 PM  Temp 36.8 C 04/13/2018  2:45 PM  Pulse 74 04/13/2018  2:52 PM  Resp 15 04/13/2018  2:52 PM  SpO2 98 % 04/13/2018  2:52 PM  Vitals shown include unvalidated device data.  Last Pain:  Vitals:   04/13/18 1046  TempSrc: Oral  PainSc: 0-No pain      Patients Stated Pain Goal: 5 (04/13/18 1046)  Immediate Anesthesia Transfer of Care Note  Patient: Kara Duffy  Procedure(s) Performed: Procedure(s) (LRB): LAPAROSCOPIC ASSISTED VAGINAL HYSTERECTOMY WITH SALPINGECTOMY (Bilateral)  Patient Location: PACU  Anesthesia Type: General  Level of Consciousness: awake, alert  and oriented  Airway & Oxygen Therapy: Patient Spontanous Breathing and Patient connected to nasal cannula oxygen  Post-op Assessment: Report given to PACU RN and Post -op Vital signs reviewed and stable  Post vital signs: Reviewed and stable  Complications: No apparent anesthesia complications

## 2018-04-13 NOTE — Anesthesia Preprocedure Evaluation (Addendum)
Anesthesia Evaluation  Patient identified by MRN, date of birth, ID band Patient awake    Reviewed: Allergy & Precautions, NPO status , Patient's Chart, lab work & pertinent test results  History of Anesthesia Complications Negative for: history of anesthetic complications  Airway Mallampati: III  TM Distance: >3 FB Neck ROM: Full    Dental no notable dental hx. (+) Teeth Intact   Pulmonary neg pulmonary ROS,    Pulmonary exam normal        Cardiovascular + DVT  Normal cardiovascular exam     Neuro/Psych negative neurological ROS  negative psych ROS   GI/Hepatic negative GI ROS, Neg liver ROS,   Endo/Other  negative endocrine ROS  Renal/GU negative Renal ROS  negative genitourinary   Musculoskeletal negative musculoskeletal ROS (+)   Abdominal   Peds  Hematology  (+) anemia ,   Anesthesia Other Findings   Reproductive/Obstetrics negative OB ROS                            Anesthesia Physical Anesthesia Plan  ASA: II  Anesthesia Plan: General   Post-op Pain Management:    Induction:   PONV Risk Score and Plan: 4 or greater and Ondansetron, Dexamethasone, Midazolam and Treatment may vary due to age or medical condition  Airway Management Planned: Oral ETT  Additional Equipment: None  Intra-op Plan:   Post-operative Plan: Extubation in OR  Informed Consent: I have reviewed the patients History and Physical, chart, labs and discussed the procedure including the risks, benefits and alternatives for the proposed anesthesia with the patient or authorized representative who has indicated his/her understanding and acceptance.     Plan Discussed with:   Anesthesia Plan Comments:        Anesthesia Quick Evaluation

## 2018-04-13 NOTE — Anesthesia Postprocedure Evaluation (Signed)
Anesthesia Post Note  Patient: Kara Duffy  Procedure(s) Performed: LAPAROSCOPIC ASSISTED VAGINAL HYSTERECTOMY WITH SALPINGECTOMY (Bilateral Abdomen)     Patient location during evaluation: PACU Anesthesia Type: General Level of consciousness: awake and alert Pain management: pain level controlled Vital Signs Assessment: post-procedure vital signs reviewed and stable Respiratory status: spontaneous breathing, nonlabored ventilation and respiratory function stable Cardiovascular status: blood pressure returned to baseline and stable Postop Assessment: no apparent nausea or vomiting Anesthetic complications: no    Last Vitals:  Vitals:   04/13/18 1500 04/13/18 1515  BP: 137/87   Pulse: 65   Resp: 16 17  Temp:    SpO2: 100%     Last Pain:  Vitals:   04/13/18 1515  TempSrc:   PainSc: 7                  Carolyn E Witman

## 2018-04-13 NOTE — Progress Notes (Signed)
Subjective: Patient reports tolerating PO.    Objective: I have reviewed patient's vital signs, intake and output and medications.  General: alert and no distress GI: soft, non-tender; bowel sounds normal; no masses,  no organomegaly Extremities: extremities normal, atraumatic, no cyanosis or edema Vaginal Bleeding: minimal   Assessment/Plan:  Doing well  Home in the morning.    LOS: 0 days    Eli Hose 04/13/2018, 6:27 PM

## 2018-04-13 NOTE — Progress Notes (Signed)
The patient was interviewed and examined today.  The previously documented history and physical examination was reviewed. There are no changes. The operative procedure was reviewed. The risks and benefits were outlined again. The specific risks include, but are not limited to, anesthetic complications, bleeding, infections, and possible damage to the surrounding organs. The patient's questions were answered.  We are ready to proceed as outlined. The likelihood of the patient achieving the goals of this procedure is very likely.   BP (!) 134/94   Pulse 72   Temp 98.1 F (36.7 C) (Oral)   Resp 16   Ht 5\' 6"  (1.676 m)   Wt 93.4 kg   SpO2 100%   BMI 33.25 kg/m   CBC    Component Value Date/Time   WBC 8.0 04/08/2018 0858   RBC 3.88 04/08/2018 0858   HGB 10.7 (L) 04/08/2018 0858   HGB 12.3 02/12/2014 1236   HCT 34.6 (L) 04/08/2018 0858   HCT 38.6 02/12/2014 1236   PLT 284 04/08/2018 0858   PLT 274 02/12/2014 1236   MCV 89.2 04/08/2018 0858   MCV 91.2 02/12/2014 1236   MCH 27.6 04/08/2018 0858   MCHC 30.9 04/08/2018 0858   RDW 13.2 04/08/2018 0858   RDW 15.1 (H) 02/12/2014 1236   LYMPHSABS 3.0 02/12/2014 1236   MONOABS 0.5 02/12/2014 1236   EOSABS 0.3 02/12/2014 1236   BASOSABS 0.1 02/12/2014 [place], M.D.

## 2018-04-13 NOTE — Op Note (Signed)
OPERATIVE NOTE  Kara Duffy  DOB:    January 15, 1976  MRN:    [id number]  CSN:    [id number]  Date of Surgery:  04/13/2018  Preoperative Diagnosis:  Pelvic pain Menorrhagia Fibroid uterus History of deep venous thrombosis History of endometriosis Anemia  Postoperative Diagnosis:  Same  Procedure:  Laparoscopy Assisted Vaginal Hysterectomy  Bilateral salpingectomy  Surgeon:  Gildardo Cranker, M.D.  Assistant:  Earnstine Regal PA-C  Anesthetic:  General  Disposition:  The patient presents with the above-mentioned diagnosis. She understands the indications for surgical procedure.  She also understands the alternative treatment options. She accepts the risk of, but not limited to, anesthetic complications, bleeding, infections, and possible damage to the surrounding organs.  Findings:  The patient had a 149 g uterus.  One fibroid was noted.  They appear to be adenomyosis.  There was a powder burn in the left posterior cul-de-sac consistent with endometriosis.  The ovaries appeared normal bilaterally.  The fallopian tubes appeared normal.  There were no adhesions noted in the pelvis.  The liver, gallbladder, bowel, and appendix appeared normal.  Procedure:  The patient was taken to the operating room where a general anesthetic was given. The patient's  abdomen was prepped with ChloraPrep. The perineum and vagina were prepped with multiple layers of Betadine. A Foley catheter was placed in the bladder. An examination under anesthesia was performed. A Hulka tenaculum was placed inside the uterus. The patient was sterilely draped. The subumbilical area was injected with half percent Marcaine with epinephrine. An incision was made and carried sharply through the subcutaneous tissue, the fascia, and the anterior peritoneum.  Hossein cannula was sutured into place. The laparoscope was inserted. Operative findings are mentioned above. The mid abdomen was injected with half  percent Marcaine with epinephrine to the left of the midline. A small incision was made and a 5 mm trocar was inserted into the abdominal cavity under direct visualization. Pictures were taken of the patient's pelvic structures. The right round ligament was identified. The round ligament was cauterized using the bipolar cautery. The round ligament was then cut.  At this point we felt we were ready to proceed with the vaginal portion of the procedure. The patient was placed in a more lithotomy position. A weighted speculum was placed in the posterior vagina. The cervix was injected with half percent Marcaine with epinephrine. A circumferential incision was made around the cervix. The vaginal mucosa was advanced anteriorly and posteriorly. The anterior cul-de-sac and in the posterior cul-de-sac were sharply entered. Alternating from right to left the uterosacral ligaments, paracervical tissues, parametrial tissues, and uterine arteries were clamped, cut, sutured, and tied securely.  The uterus was inverted through the posterior colpotomy.  The remainder of the upper pedicles were clamped and cut..  The uterus was removed from the operative field. Hemostasis was confirmed. The sutures attached to the uterosacral ligaments were brought out through the vaginal angles and then tied securely. A McCall culdoplasty suture was placed in the posterior cul-de-sac incorporating the uterosacral ligaments bilaterally and the posterior peritoneum. A final check was made for hemostasis and again hemostasis was confirmed.  20 mg of Exparel were injected into the suture lines of the pelvis and the vaginal cuff.  The vaginal cuff was closed using figure-of-eight sutures incorporating the anterior vaginal mucosa, the anterior peritoneum, posterior peritoneum, and the posterior vaginal mucosa. The McCall culdoplasty suture was tied securely and the apex of the vagina was noted to elevate into the  midpelvis. The operator then changed gown  and gloves. The pneumoperitoneum was reestablished. The pelvis was inspected and hemostasis was adequate. The pelvis was irrigated. We felt that we are ready to end the procedure. The 5 mm trochar was removed under direct visualization. The pneumoperitoneum was allowed to escape. The subumbilical trocar was removed. The subumbilical incision was closed using a deep suture of 0 Vicryl followed by skin closures of 3-0 Monocryl. The patient tolerated her procedure well. She was awakened from her anesthetic without difficulty and then transported to the recovery room in stable condition. Sponge, needle, and instrument counts were correct on 2 occasions. The estimated blood loss was 50 cc's. 0 Vicryl is the suture material used throughout the procedure. The uterus was sent to pathology.   Gildardo Cranker, M.D.  April 13, 2018

## 2018-04-14 ENCOUNTER — Encounter (HOSPITAL_BASED_OUTPATIENT_CLINIC_OR_DEPARTMENT_OTHER): Payer: Self-pay | Admitting: Obstetrics and Gynecology

## 2018-04-14 DIAGNOSIS — I10 Essential (primary) hypertension: Secondary | ICD-10-CM | POA: Diagnosis not present

## 2018-04-14 DIAGNOSIS — N800 Endometriosis of uterus: Secondary | ICD-10-CM | POA: Diagnosis not present

## 2018-04-14 DIAGNOSIS — R102 Pelvic and perineal pain: Secondary | ICD-10-CM | POA: Diagnosis not present

## 2018-04-14 DIAGNOSIS — N841 Polyp of cervix uteri: Secondary | ICD-10-CM | POA: Diagnosis not present

## 2018-04-14 DIAGNOSIS — D649 Anemia, unspecified: Secondary | ICD-10-CM | POA: Diagnosis not present

## 2018-04-14 DIAGNOSIS — Z86718 Personal history of other venous thrombosis and embolism: Secondary | ICD-10-CM | POA: Diagnosis not present

## 2018-04-14 DIAGNOSIS — N920 Excessive and frequent menstruation with regular cycle: Secondary | ICD-10-CM | POA: Diagnosis not present

## 2018-04-14 DIAGNOSIS — Z79899 Other long term (current) drug therapy: Secondary | ICD-10-CM | POA: Diagnosis not present

## 2018-04-14 DIAGNOSIS — R51 Headache: Secondary | ICD-10-CM | POA: Diagnosis not present

## 2018-04-14 DIAGNOSIS — D259 Leiomyoma of uterus, unspecified: Secondary | ICD-10-CM | POA: Diagnosis not present

## 2018-04-14 LAB — BASIC METABOLIC PANEL
Anion gap: 6 (ref 5–15)
BUN: 10 mg/dL (ref 6–20)
CO2: 24 mmol/L (ref 22–32)
CREATININE: 0.85 mg/dL (ref 0.44–1.00)
Calcium: 9 mg/dL (ref 8.9–10.3)
Chloride: 108 mmol/L (ref 98–111)
GFR calc Af Amer: >60 mL/min (ref >60)
GFR calc non Af Amer: >60 mL/min (ref >60)
GLUCOSE: 116 mg/dL — AB (ref 70–99)
Potassium: 4.2 mmol/L (ref 3.5–5.1)
Sodium: 138 mmol/L (ref 135–145)

## 2018-04-14 LAB — CBC
HEMATOCRIT: 30.6 % — AB (ref 36.0–46.0)
Hemoglobin: 9.4 g/dL — ABNORMAL LOW (ref 12.0–15.0)
MCH: 27.2 pg (ref 26.0–34.0)
MCHC: 30.7 g/dL (ref 30.0–36.0)
MCV: 88.4 fL (ref 80.0–100.0)
Platelets: 282 10*3/uL (ref 150–400)
RBC: 3.46 MIL/uL — ABNORMAL LOW (ref 3.87–5.11)
RDW: 13 % (ref 11.5–15.5)
WBC: 11.1 10*3/uL — ABNORMAL HIGH (ref 4.0–10.5)
nRBC: 0 % (ref 0.0–0.2)

## 2018-04-14 MED ORDER — OXYCODONE-ACETAMINOPHEN 5-325 MG/5ML PO SOLN: 5.0000 mL | ORAL | 0 refills | Status: DC | PRN

## 2018-04-14 MED ORDER — OXYCODONE-ACETAMINOPHEN 2.5-325 MG PO TABS: 1.0000 | ORAL_TABLET | ORAL | 0 refills | Status: DC | PRN

## 2018-04-14 MED ORDER — OXYCODONE-ACETAMINOPHEN 10-325 MG PO TABS: 1.0000 | ORAL_TABLET | ORAL | 0 refills | Status: DC | PRN

## 2018-04-14 MED ORDER — IBUPROFEN 800 MG PO TABS: 800.0000 mg | ORAL_TABLET | Freq: Three times a day (TID) | ORAL | 1 refills | Status: AC | PRN

## 2018-04-14 NOTE — Discharge Summary (Signed)
Physician Discharge Summary  Patient ID: Kara Duffy MRN: [id number] DOB/AGE: May 28, 1975 42 y.o.  Admit date:         04/13/2018 Discharge date: 04/14/2018  Admission Diagnoses:  Endometriosis, Uterine Leiomyoma, Cervical Polyp, Pelvic Pain, endometriosis, irregular bleeding anemia, history of deep venous thrombosis  Discharge Diagnoses:   Endometriosis, Uterine Leiomyoma, Cervical Polyp, Pelvic Pain, endometriosis, irregular bleeding  Anemia, history of deep venous thrombosis  Procedures this Admission:  04/13/2018  Procedure(s) (LRB): LAPAROSCOPIC ASSISTED VAGINAL HYSTERECTOMY WITH SALPINGECTOMY (Bilateral)  Discharged Condition: good   Admission Hx and PE: The patient has been followed at the [facility]. She has a history of  Endometriosis, Uterine Leiomyoma, Cervical Polyp, Pelvic Pain. Please see her documented history and physical exam.  The patient has a history of pelvic pain, menorrhagia, and fibroids.  She wishes to proceed with definitive therapy at this time.  Hospital course:  On the day of admission, the patient underwent the following Procedure(s): LAPAROSCOPIC ASSISTED VAGINAL HYSTERECTOMY WITH SALPINGECTOMY. Operative findings included a 149 g fibroid uterus.  The fallopian tubes and ovaries appeared normal.  There was an area of endometriosis in the posterior cul-de-sac.  The bowel, appendix, gallbladder, and liver appeared normal.. Her postoperative course was uneventful. She quickly tolerated a regular diet. Her postoperative pain was controlled with oral medication. She remained afebrile. Today she was felt to be ready for discharge.  Labs:  Hemoglobin  Date Value Ref Range Status  04/14/2018 9.4 (L) 12.0 - 15.0 g/dL Final   HGB  Date Value Ref Range Status  02/12/2014 12.3 11.6 - 15.9 g/dL Final   HCT  Date Value Ref Range Status  04/14/2018 30.6 (L) 36.0 - 46.0 % Final  02/12/2014 38.6  34.8 - 46.6 % Final    Consults: None  Final pathology report: Pending at the time of discharge  BP 120/77 (BP Location: Right Arm)   Pulse 76   Temp 98.3 F (36.8 C)   Resp 16   Ht 5\' 6"  (1.676 m)   Wt 93.4 kg   SpO2 99%   BMI 33.25 kg/m   CBC    Component Value Date/Time   WBC 11.1 (H) 04/14/2018 0512   RBC 3.46 (L) 04/14/2018 0512   HGB 9.4 (L) 04/14/2018 0512   HGB 12.3 02/12/2014 1236   HCT 30.6 (L) 04/14/2018 0512   HCT 38.6 02/12/2014 1236   PLT 282 04/14/2018 0512   PLT 274 02/12/2014 1236   MCV 88.4 04/14/2018 0512   MCV 91.2 02/12/2014 1236   MCH 27.2 04/14/2018 0512   MCHC 30.7 04/14/2018 0512   RDW 13.0 04/14/2018 0512   RDW 15.1 (H) 02/12/2014 1236   LYMPHSABS 3.0 02/12/2014 1236   MONOABS 0.5 02/12/2014 1236   EOSABS 0.3 02/12/2014 1236   BASOSABS 0.1 02/12/2014 1236    HEENT: No distress Heart: Regular rate and rhythm Chest: Clear Abdomen: Soft and appropriately tender.  Bowel sounds positive.  Incisions well-healed Extremities: No masses or edema Pad check: Minimal spotting  Disposition:  The patient will be discharged to home. She has been given a copy of the discharge instructions as prepared by the [facility] for patients who have undergone the Procedure(s): LAPAROSCOPIC ASSISTED VAGINAL HYSTERECTOMY WITH SALPINGECTOMY.    Allergies as of 04/14/2018   No Known Allergies     Medication List    STOP taking these medications   acetaminophen 500 MG tablet Commonly known as:  TYLENOL  TAKE these medications   cetirizine 10 MG tablet Commonly known as:  ZYRTEC Take 5 mg by mouth daily.   EMERGEN-C VITAMIN C PO Take 1 tablet by mouth daily as needed (for immune system).   ibuprofen 800 MG tablet Commonly known as:  ADVIL,MOTRIN Take 1 tablet (800 mg total) by mouth every 8 (eight) hours as needed. What changed:    medication strength  how much to take  when to take this  reasons to take this    oxyCODONE-acetaminophen 5-325 MG/5ML solution Commonly known as:  ROXICET Take 5 mLs by mouth every 4 (four) hours as needed for severe pain.   valACYclovir 500 MG tablet Commonly known as:  VALTREX Take 500 mg by mouth daily.      Follow-up Information    Ena Dawley, MD On 05/23/2018.   Specialty:  Obstetrics and Gynecology Why:  appointment time is 1:45 p.m. Contact information: [address] [place] [address] [phone]           Signed: Eli Hose 04/14/2018, 8:43 AM

## 2018-04-14 NOTE — Discharge Instructions (Signed)
Laparoscopically Assisted Vaginal Hysterectomy, Care After Refer to this sheet in the next few weeks. These instructions provide you with information on caring for yourself after your procedure. Your health care provider may also give you more specific instructions. Your treatment has been planned according to current medical practices, but problems sometimes occur. Call your health care provider if you have any problems or questions after your procedure. What can I expect after the procedure? After your procedure, it is typical to have the following:  Abdominal pain. You will be given pain medicine to control it.  Sore throat from the breathing tube that was inserted during surgery.  Follow these instructions at home:  Only take over-the-counter or prescription medicines for pain, discomfort, or fever as directed by your health care provider.  Do not take aspirin. It can cause bleeding.  Do not drive when taking pain medicine.  Follow your health care provider's advice regarding diet, exercise, lifting, driving, and general activities.  Resume your usual diet as directed and allowed.  Get plenty of rest and sleep.  Do not douche, use tampons, or have sexual intercourse for at least 6 weeks, or until your health care provider gives you permission.  Change your bandages (dressings) as directed by your health care provider.  Monitor your temperature and notify your health care provider of a fever.  Take showers instead of baths for 2-3 weeks.  Do not drink alcohol until your health care provider gives you permission.  If you develop constipation, you may take a mild laxative with your health care provider's permission. Bran foods may help with constipation problems. Drinking enough fluids to keep your urine clear or pale yellow may help as well.  Try to have someone home with you for 1-2 weeks to help around the house.  Keep all of your follow-up appointments as directed by your  health care provider. Contact a health care provider if:  You have swelling, redness, or increasing pain around your incision sites.  You have pus coming from your incision.  You notice a bad smell coming from your incision.  Your incision breaks open.  You feel dizzy or lightheaded.  You have pain or bleeding when you urinate.  You have persistent diarrhea.  You have persistent nausea and vomiting.  You have abnormal vaginal discharge.  You have a rash.  You have any type of abnormal reaction or develop an allergy to your medicine.  You have poor pain control with your prescribed medicine. Get help right away if:  You have a fever.  You have severe abdominal pain.  You have chest pain.  You have shortness of breath.  You faint.  You have pain, swelling, or redness in your leg.  You have heavy vaginal bleeding with blood clots. This information is not intended to replace advice given to you by your health care provider. Make sure you discuss any questions you have with your health care provider. Document Released: 04/30/2011 Document Revised: 10/17/2015 Document Reviewed: 11/24/2012 Elsevier Interactive Patient Education  2017 [facility] @ [phone] if:  You have a temperature greater than or equal to 100.4 degrees Farenheit orally You have pain that is not made better by the pain medication given and taken as directed You have excessive bleeding or problems urinating  Take Colace (Docusate Sodium/Stool Softener) 100 mg 2-3 times daily while taking narcotic pain medicine to avoid constipation or until bowel movements are regular. Take, with food,  Ibuprofen 600 mg every  6 hours for 5 days then as needed for pain  You may drive after 2  weeks You may walk up steps  You may shower  You may resume a regular diet  Keep incisions clean and dry Do not lift over 15 pounds for 6 weeks Avoid anything in vagina for 6 weeks (or  until after your post-operative visit)

## 2018-04-18 DIAGNOSIS — R030 Elevated blood-pressure reading, without diagnosis of hypertension: Secondary | ICD-10-CM | POA: Diagnosis not present

## 2018-05-06 DIAGNOSIS — I10 Essential (primary) hypertension: Secondary | ICD-10-CM | POA: Diagnosis not present

## 2018-05-06 DIAGNOSIS — R030 Elevated blood-pressure reading, without diagnosis of hypertension: Secondary | ICD-10-CM | POA: Diagnosis not present

## 2018-05-06 DIAGNOSIS — E559 Vitamin D deficiency, unspecified: Secondary | ICD-10-CM | POA: Diagnosis not present

## 2018-05-06 DIAGNOSIS — Z0000 Encounter for general adult medical examination without abnormal findings: Secondary | ICD-10-CM | POA: Diagnosis not present

## 2018-05-13 DIAGNOSIS — I10 Essential (primary) hypertension: Secondary | ICD-10-CM | POA: Diagnosis not present

## 2018-05-13 DIAGNOSIS — Z0000 Encounter for general adult medical examination without abnormal findings: Secondary | ICD-10-CM | POA: Diagnosis not present

## 2018-06-01 DIAGNOSIS — M25471 Effusion, right ankle: Secondary | ICD-10-CM | POA: Diagnosis not present

## 2018-06-03 DIAGNOSIS — Z23 Encounter for immunization: Secondary | ICD-10-CM | POA: Diagnosis not present

## 2018-07-15 DIAGNOSIS — I10 Essential (primary) hypertension: Secondary | ICD-10-CM | POA: Diagnosis not present

## 2018-07-15 DIAGNOSIS — E559 Vitamin D deficiency, unspecified: Secondary | ICD-10-CM | POA: Diagnosis not present

## 2018-11-14 DIAGNOSIS — R0683 Snoring: Secondary | ICD-10-CM | POA: Diagnosis not present

## 2018-11-14 DIAGNOSIS — E559 Vitamin D deficiency, unspecified: Secondary | ICD-10-CM | POA: Diagnosis not present

## 2018-11-14 DIAGNOSIS — I10 Essential (primary) hypertension: Secondary | ICD-10-CM | POA: Diagnosis not present

## 2018-12-01 DIAGNOSIS — Z113 Encounter for screening for infections with a predominantly sexual mode of transmission: Secondary | ICD-10-CM | POA: Diagnosis not present

## 2018-12-01 DIAGNOSIS — N898 Other specified noninflammatory disorders of vagina: Secondary | ICD-10-CM | POA: Diagnosis not present

## 2019-01-19 ENCOUNTER — Encounter: Payer: Self-pay | Admitting: Pulmonary Disease

## 2019-01-19 ENCOUNTER — Ambulatory Visit (INDEPENDENT_AMBULATORY_CARE_PROVIDER_SITE_OTHER): Payer: BC Managed Care – PPO | Admitting: Pulmonary Disease

## 2019-01-19 ENCOUNTER — Other Ambulatory Visit: Payer: Self-pay

## 2019-01-19 VITALS — BP 142/88 | HR 87 | Temp 98.0°F | Ht 65.25 in | Wt 213.6 lb

## 2019-01-19 DIAGNOSIS — Z23 Encounter for immunization: Secondary | ICD-10-CM | POA: Diagnosis not present

## 2019-01-19 DIAGNOSIS — R0683 Snoring: Secondary | ICD-10-CM

## 2019-01-19 NOTE — Progress Notes (Signed)
Subjective:    Patient ID: Kara Duffy, female    DOB: September 15, 1975, 43 y.o.   MRN: [id number]  HPI   Chief Complaint  Patient presents with  . 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 Her husband has noted loud snoring for about 4 nights in a week for the past few months.  She denies excessive daytime somnolence but does admit to some level of fatigue. Epworth sleepiness score is 4. She has 72  76-year-olds at home.  There is no history of witnessed apneas or similar episodes in the past.  She has gained some weight, she was about to 40 pounds after her pregnancy 5 years ago, last to as low as 198 pounds but has now gained back up to about 220 pounds. Bedtime is 11 PM or later, sleep latency is minimal, occasionally if she is unable to fall asleep she will leave the TV on, she sleeps on her side but will occasionally turn over on her back, uses only one pillow and denies orthopnea. She has 2 nocturnal awakenings including a bathroom visit and is out of bed by 5:30 AM feeling tired and exhausted with dryness of mouth but denies headaches.   There is no history suggestive of cataplexy, sleep paralysis or parasomnias She does not have any dental issues   Past Medical History:  Diagnosis Date  . Abnormal Pap smear    cin-I  . Allergy   . BV (bacterial vaginosis)    2 wks ago  . Clotting disorder ([employer])   . DVT (deep venous thrombosis) ([place]) 2013   right leg - no meds currently  . DVT prophylaxis 07/10/2013  . Endometriosis   . H/O chlamydia infection   . H/O molluscum contagiosum   . Headache(784.0)    otc meds  . Herpes simplex without mention of complication   . No pertinent past medical history   . Pregnancy 07/10/2013     Past Surgical History:  Procedure Laterality Date  . BUNIONECTOMY  2012  . CESAREAN SECTION N/A 01/03/2014   Procedure: CESAREAN SECTION;  Surgeon:  Alinda Dooms, MD;  Location: [facility];  Service: Obstetrics;  Laterality: N/A;  . DILATION AND CURETTAGE OF UTERUS  2010  . LAPAROSCOPIC VAGINAL HYSTERECTOMY WITH SALPINGECTOMY Bilateral 04/13/2018   Procedure: LAPAROSCOPIC ASSISTED VAGINAL HYSTERECTOMY WITH SALPINGECTOMY;  Surgeon: Ena Dawley, MD;  Location: [facility];  Service: Gynecology;  Laterality: Bilateral;  . LAPAROSCOPY  03/30/2012   Procedure: LAPAROSCOPY OPERATIVE;  Surgeon: Delice Lesch, MD;  Location: [facility];  Service: Gynecology;  Laterality: N/A;  . OVARIAN CYST REMOVAL  03/30/2012   Procedure: OVARIAN CYSTECTOMY;  Surgeon: Delice Lesch, MD;  Location: [facility];  Service: Gynecology;  Laterality: Left;    No Known Allergies   Social History   Socioeconomic History  . Marital status: Married    Spouse name: Not on file  . Number of children: Not on file  . Years of education: Not on file  . Highest education level: Not on file  Occupational History  . Not on file  Social Needs  . Financial resource strain: Not on file  . Food insecurity    Worry: Not on file    Inability: Not on file  . Transportation needs    Medical: Not on file    Non-medical: Not on file  Tobacco Use  .  Smoking status: Never Smoker  . Smokeless tobacco: Never Used  Substance and Sexual Activity  . Alcohol use: Yes    Comment: 4 times per week   . Drug use: No  . Sexual activity: Yes    Partners: Male    Birth control/protection: I.U.D.    Comment: paragard 07/20/11  Lifestyle  . Physical activity    Days per week: Not on file    Minutes per session: Not on file  . Stress: Not on file  Relationships  . Social Herbalist on phone: Not on file    Gets together: Not on file    Attends religious service: Not on file    Active member of club or organization: Not on file    Attends meetings of clubs or organizations: Not on file    Relationship status: Not on file  . Intimate partner violence     Fear of current or ex partner: Not on file    Emotionally abused: Not on file    Physically abused: Not on file    Forced sexual activity: Not on file  Other Topics Concern  . Not on file  Social History Narrative  . Not on file     Family History  Problem Relation Age of Onset  . Hypertension Mother   . Diabetes Mother   . Deep vein thrombosis Mother   . Hypertension Father   . Hyperlipidemia Father   . Diabetes Paternal Uncle   . Diabetes Paternal Grandmother   . Diabetes Paternal Uncle   . Diabetes Cousin      Review of Systems  Constitutional: Negative for fever and unexpected weight change.  HENT: Negative for congestion, dental problem, ear pain, nosebleeds, postnasal drip, rhinorrhea, sinus pressure, sneezing, sore throat and trouble swallowing.   Eyes: Negative for redness and itching.  Respiratory: Negative for cough, chest tightness, shortness of breath and wheezing.   Cardiovascular: Negative for palpitations and leg swelling.  Gastrointestinal: Negative for nausea and vomiting.  Genitourinary: Negative for dysuria.  Musculoskeletal: Negative for joint swelling.  Skin: Negative for rash.  Allergic/Immunologic: Negative.  Negative for environmental allergies, food allergies and immunocompromised state.  Neurological: Negative for headaches.  Hematological: Does not bruise/bleed easily.  Psychiatric/Behavioral: Positive for dysphoric mood. The patient is nervous/anxious.        Objective:   Physical Exam  Gen. Pleasant, obese, in no distress, normal affect ENT - no pallor,icterus, no post nasal drip, class 2 airway, normal bite Neck: No JVD, no thyromegaly, no carotid bruits Lungs: no use of accessory muscles, no dullness to percussion, decreased without rales or rhonchi  Cardiovascular: Rhythm regular, heart sounds  normal, no murmurs or gallops, no peripheral edema Abdomen: soft and non-tender, no hepatosplenomegaly, BS normal. Musculoskeletal: No  deformities, no cyanosis or clubbing Neuro:  alert, non focal, no tremors       Assessment & Plan:

## 2019-01-19 NOTE — Assessment & Plan Note (Addendum)
Given excessive daytime fatigue, narrow pharyngeal exam, witnessed apneas & loud snoring, obstructive sleep apnea is very likely & an overnight polysomnogram will be scheduled as a home study. The pathophysiology of obstructive sleep apnea , it's cardiovascular consequences & modes of treatment including CPAP were discused with the patient in detail & they evidenced understanding.  Pretest probability is low to moderate.  She is considering phase shifting and going to an earlier sleep time of 8:30 PM Weight loss of 15 to 20 pounds advised

## 2019-01-19 NOTE — Patient Instructions (Signed)
  Home sleep test will be scheduled. We discussed treatment options for snoring as well as mild obstructive sleep apnea

## 2019-02-01 ENCOUNTER — Other Ambulatory Visit: Payer: Self-pay

## 2019-02-01 ENCOUNTER — Ambulatory Visit: Payer: BC Managed Care – PPO

## 2019-02-01 DIAGNOSIS — R0683 Snoring: Secondary | ICD-10-CM

## 2019-02-07 ENCOUNTER — Telehealth: Payer: Self-pay | Admitting: Pulmonary Disease

## 2019-02-07 DIAGNOSIS — R0683 Snoring: Secondary | ICD-10-CM | POA: Diagnosis not present

## 2019-02-07 MED ORDER — FLUTICASONE PROPIONATE 50 MCG/ACT NA SUSP: 1.0000 | Freq: Every day | NASAL | 2 refills | Status: AC

## 2019-02-07 NOTE — Telephone Encounter (Signed)
Spoke with pt, aware of recs.  rx sent to preferred pharmacy.  Nothing further needed at this time- will close encounter.   

## 2019-02-07 NOTE — Telephone Encounter (Signed)
No significant OSA on sleep study Should focus on weight loss alone Can trial nasal steroids for snoring or oral appliance

## 2019-02-07 NOTE — Telephone Encounter (Signed)
Flonase 1 spray each nare qhs

## 2019-02-07 NOTE — Telephone Encounter (Signed)
Spoke with pt, aware of results/recs.  Pt would like to try a nasal steroid first.  Pt is concerned about cost and side effects of medications, would like a generic medication if possible.  Pharmacy: [facility] on [address].  RA please advise on which nasal steroid you would recommend.  Thanks!

## 2019-04-12 DIAGNOSIS — N951 Menopausal and female climacteric states: Secondary | ICD-10-CM | POA: Diagnosis not present

## 2019-04-12 DIAGNOSIS — Z6834 Body mass index (BMI) 34.0-34.9, adult: Secondary | ICD-10-CM | POA: Diagnosis not present

## 2019-04-12 DIAGNOSIS — Z124 Encounter for screening for malignant neoplasm of cervix: Secondary | ICD-10-CM | POA: Diagnosis not present

## 2019-04-12 DIAGNOSIS — Z1231 Encounter for screening mammogram for malignant neoplasm of breast: Secondary | ICD-10-CM | POA: Diagnosis not present

## 2019-04-12 DIAGNOSIS — Z01419 Encounter for gynecological examination (general) (routine) without abnormal findings: Secondary | ICD-10-CM | POA: Diagnosis not present

## 2019-04-12 DIAGNOSIS — A609 Anogenital herpesviral infection, unspecified: Secondary | ICD-10-CM | POA: Diagnosis not present

## 2019-05-10 DIAGNOSIS — F411 Generalized anxiety disorder: Secondary | ICD-10-CM | POA: Diagnosis not present

## 2019-05-10 DIAGNOSIS — F330 Major depressive disorder, recurrent, mild: Secondary | ICD-10-CM | POA: Diagnosis not present

## 2019-05-16 DIAGNOSIS — R030 Elevated blood-pressure reading, without diagnosis of hypertension: Secondary | ICD-10-CM | POA: Diagnosis not present

## 2019-05-16 DIAGNOSIS — Z0000 Encounter for general adult medical examination without abnormal findings: Secondary | ICD-10-CM | POA: Diagnosis not present

## 2019-05-16 DIAGNOSIS — E559 Vitamin D deficiency, unspecified: Secondary | ICD-10-CM | POA: Diagnosis not present

## 2019-05-16 DIAGNOSIS — I10 Essential (primary) hypertension: Secondary | ICD-10-CM | POA: Diagnosis not present

## 2019-05-17 DIAGNOSIS — F411 Generalized anxiety disorder: Secondary | ICD-10-CM | POA: Diagnosis not present

## 2019-05-17 DIAGNOSIS — F330 Major depressive disorder, recurrent, mild: Secondary | ICD-10-CM | POA: Diagnosis not present

## 2019-05-23 DIAGNOSIS — Z0000 Encounter for general adult medical examination without abnormal findings: Secondary | ICD-10-CM | POA: Diagnosis not present

## 2019-05-23 DIAGNOSIS — F411 Generalized anxiety disorder: Secondary | ICD-10-CM | POA: Diagnosis not present

## 2019-05-23 DIAGNOSIS — F330 Major depressive disorder, recurrent, mild: Secondary | ICD-10-CM | POA: Diagnosis not present

## 2019-05-23 DIAGNOSIS — E559 Vitamin D deficiency, unspecified: Secondary | ICD-10-CM | POA: Diagnosis not present

## 2019-05-31 DIAGNOSIS — F411 Generalized anxiety disorder: Secondary | ICD-10-CM | POA: Diagnosis not present

## 2019-05-31 DIAGNOSIS — F330 Major depressive disorder, recurrent, mild: Secondary | ICD-10-CM | POA: Diagnosis not present

## 2019-06-07 DIAGNOSIS — F330 Major depressive disorder, recurrent, mild: Secondary | ICD-10-CM | POA: Diagnosis not present

## 2019-06-07 DIAGNOSIS — R600 Localized edema: Secondary | ICD-10-CM | POA: Diagnosis not present

## 2019-06-07 DIAGNOSIS — M79604 Pain in right leg: Secondary | ICD-10-CM | POA: Diagnosis not present

## 2019-06-07 DIAGNOSIS — F411 Generalized anxiety disorder: Secondary | ICD-10-CM | POA: Diagnosis not present

## 2019-06-07 DIAGNOSIS — M7661 Achilles tendinitis, right leg: Secondary | ICD-10-CM | POA: Diagnosis not present

## 2019-06-13 DIAGNOSIS — F411 Generalized anxiety disorder: Secondary | ICD-10-CM | POA: Diagnosis not present

## 2019-06-13 DIAGNOSIS — F330 Major depressive disorder, recurrent, mild: Secondary | ICD-10-CM | POA: Diagnosis not present

## 2019-06-14 DIAGNOSIS — M79604 Pain in right leg: Secondary | ICD-10-CM | POA: Diagnosis not present

## 2019-06-19 DIAGNOSIS — M7661 Achilles tendinitis, right leg: Secondary | ICD-10-CM | POA: Diagnosis not present

## 2019-06-21 DIAGNOSIS — F330 Major depressive disorder, recurrent, mild: Secondary | ICD-10-CM | POA: Diagnosis not present

## 2019-06-21 DIAGNOSIS — F411 Generalized anxiety disorder: Secondary | ICD-10-CM | POA: Diagnosis not present

## 2019-06-23 DIAGNOSIS — M7051 Other bursitis of knee, right knee: Secondary | ICD-10-CM | POA: Diagnosis not present

## 2019-06-23 DIAGNOSIS — M76821 Posterior tibial tendinitis, right leg: Secondary | ICD-10-CM | POA: Diagnosis not present

## 2019-06-28 DIAGNOSIS — F411 Generalized anxiety disorder: Secondary | ICD-10-CM | POA: Diagnosis not present

## 2019-06-28 DIAGNOSIS — F330 Major depressive disorder, recurrent, mild: Secondary | ICD-10-CM | POA: Diagnosis not present

## 2019-07-05 DIAGNOSIS — F330 Major depressive disorder, recurrent, mild: Secondary | ICD-10-CM | POA: Diagnosis not present

## 2019-07-05 DIAGNOSIS — F411 Generalized anxiety disorder: Secondary | ICD-10-CM | POA: Diagnosis not present

## 2019-07-12 DIAGNOSIS — F330 Major depressive disorder, recurrent, mild: Secondary | ICD-10-CM | POA: Diagnosis not present

## 2019-07-12 DIAGNOSIS — F411 Generalized anxiety disorder: Secondary | ICD-10-CM | POA: Diagnosis not present

## 2019-07-19 DIAGNOSIS — F330 Major depressive disorder, recurrent, mild: Secondary | ICD-10-CM | POA: Diagnosis not present

## 2019-07-19 DIAGNOSIS — F411 Generalized anxiety disorder: Secondary | ICD-10-CM | POA: Diagnosis not present

## 2019-07-26 DIAGNOSIS — F411 Generalized anxiety disorder: Secondary | ICD-10-CM | POA: Diagnosis not present

## 2019-07-26 DIAGNOSIS — F330 Major depressive disorder, recurrent, mild: Secondary | ICD-10-CM | POA: Diagnosis not present

## 2019-08-02 DIAGNOSIS — F411 Generalized anxiety disorder: Secondary | ICD-10-CM | POA: Diagnosis not present

## 2019-08-02 DIAGNOSIS — F330 Major depressive disorder, recurrent, mild: Secondary | ICD-10-CM | POA: Diagnosis not present

## 2019-08-09 DIAGNOSIS — F330 Major depressive disorder, recurrent, mild: Secondary | ICD-10-CM | POA: Diagnosis not present

## 2019-08-09 DIAGNOSIS — F411 Generalized anxiety disorder: Secondary | ICD-10-CM | POA: Diagnosis not present

## 2019-08-16 DIAGNOSIS — F330 Major depressive disorder, recurrent, mild: Secondary | ICD-10-CM | POA: Diagnosis not present

## 2019-08-16 DIAGNOSIS — F411 Generalized anxiety disorder: Secondary | ICD-10-CM | POA: Diagnosis not present

## 2019-08-23 DIAGNOSIS — F330 Major depressive disorder, recurrent, mild: Secondary | ICD-10-CM | POA: Diagnosis not present

## 2019-08-23 DIAGNOSIS — F411 Generalized anxiety disorder: Secondary | ICD-10-CM | POA: Diagnosis not present

## 2019-08-30 DIAGNOSIS — F330 Major depressive disorder, recurrent, mild: Secondary | ICD-10-CM | POA: Diagnosis not present

## 2019-08-30 DIAGNOSIS — F411 Generalized anxiety disorder: Secondary | ICD-10-CM | POA: Diagnosis not present

## 2019-09-06 DIAGNOSIS — F411 Generalized anxiety disorder: Secondary | ICD-10-CM | POA: Diagnosis not present

## 2019-09-06 DIAGNOSIS — F330 Major depressive disorder, recurrent, mild: Secondary | ICD-10-CM | POA: Diagnosis not present

## 2019-09-13 DIAGNOSIS — F411 Generalized anxiety disorder: Secondary | ICD-10-CM | POA: Diagnosis not present

## 2019-09-13 DIAGNOSIS — F330 Major depressive disorder, recurrent, mild: Secondary | ICD-10-CM | POA: Diagnosis not present

## 2019-09-20 DIAGNOSIS — F330 Major depressive disorder, recurrent, mild: Secondary | ICD-10-CM | POA: Diagnosis not present

## 2019-09-20 DIAGNOSIS — F411 Generalized anxiety disorder: Secondary | ICD-10-CM | POA: Diagnosis not present

## 2019-09-21 ENCOUNTER — Ambulatory Visit (HOSPITAL_COMMUNITY)
Admission: RE | Admit: 2019-09-21 | Discharge: 2019-09-21 | Disposition: A | Discharge status: Home / Self Care | Payer: BC Managed Care – PPO | Source: Ambulatory Visit | Attending: Internal Medicine | Admitting: Internal Medicine

## 2019-09-21 ENCOUNTER — Other Ambulatory Visit (HOSPITAL_COMMUNITY): Payer: Self-pay | Admitting: Internal Medicine

## 2019-09-21 ENCOUNTER — Other Ambulatory Visit: Payer: Self-pay

## 2019-09-21 DIAGNOSIS — M7989 Other specified soft tissue disorders: Secondary | ICD-10-CM

## 2019-09-21 DIAGNOSIS — M79604 Pain in right leg: Secondary | ICD-10-CM

## 2019-09-21 DIAGNOSIS — R600 Localized edema: Secondary | ICD-10-CM | POA: Diagnosis not present

## 2019-09-21 DIAGNOSIS — Z86718 Personal history of other venous thrombosis and embolism: Secondary | ICD-10-CM | POA: Diagnosis not present

## 2019-09-21 NOTE — Progress Notes (Signed)
Lower extremity venous has been completed.   Preliminary results in CV Proc.   Kara Duffy 09/21/2019 3:46 PM

## 2019-09-27 DIAGNOSIS — F411 Generalized anxiety disorder: Secondary | ICD-10-CM | POA: Diagnosis not present

## 2019-09-27 DIAGNOSIS — F330 Major depressive disorder, recurrent, mild: Secondary | ICD-10-CM | POA: Diagnosis not present

## 2019-10-13 DIAGNOSIS — F330 Major depressive disorder, recurrent, mild: Secondary | ICD-10-CM | POA: Diagnosis not present

## 2019-10-13 DIAGNOSIS — F411 Generalized anxiety disorder: Secondary | ICD-10-CM | POA: Diagnosis not present

## 2019-10-18 DIAGNOSIS — F330 Major depressive disorder, recurrent, mild: Secondary | ICD-10-CM | POA: Diagnosis not present

## 2019-10-18 DIAGNOSIS — F411 Generalized anxiety disorder: Secondary | ICD-10-CM | POA: Diagnosis not present

## 2019-10-27 DIAGNOSIS — F331 Major depressive disorder, recurrent, moderate: Secondary | ICD-10-CM | POA: Diagnosis not present

## 2019-10-27 DIAGNOSIS — F411 Generalized anxiety disorder: Secondary | ICD-10-CM | POA: Diagnosis not present

## 2019-11-03 DIAGNOSIS — F411 Generalized anxiety disorder: Secondary | ICD-10-CM | POA: Diagnosis not present

## 2019-11-03 DIAGNOSIS — F331 Major depressive disorder, recurrent, moderate: Secondary | ICD-10-CM | POA: Diagnosis not present

## 2019-11-15 DIAGNOSIS — F411 Generalized anxiety disorder: Secondary | ICD-10-CM | POA: Diagnosis not present

## 2019-11-15 DIAGNOSIS — F330 Major depressive disorder, recurrent, mild: Secondary | ICD-10-CM | POA: Diagnosis not present

## 2019-11-20 ENCOUNTER — Other Ambulatory Visit: Payer: Self-pay | Admitting: *Deleted

## 2019-11-20 DIAGNOSIS — M79604 Pain in right leg: Secondary | ICD-10-CM

## 2019-11-22 DIAGNOSIS — F330 Major depressive disorder, recurrent, mild: Secondary | ICD-10-CM | POA: Diagnosis not present

## 2019-11-22 DIAGNOSIS — F411 Generalized anxiety disorder: Secondary | ICD-10-CM | POA: Diagnosis not present

## 2019-11-28 ENCOUNTER — Encounter (HOSPITAL_COMMUNITY): Payer: BC Managed Care – PPO

## 2019-11-28 ENCOUNTER — Encounter: Payer: BC Managed Care – PPO | Admitting: Vascular Surgery

## 2019-11-29 DIAGNOSIS — F330 Major depressive disorder, recurrent, mild: Secondary | ICD-10-CM | POA: Diagnosis not present

## 2019-11-29 DIAGNOSIS — F411 Generalized anxiety disorder: Secondary | ICD-10-CM | POA: Diagnosis not present

## 2019-12-06 DIAGNOSIS — F411 Generalized anxiety disorder: Secondary | ICD-10-CM | POA: Diagnosis not present

## 2019-12-06 DIAGNOSIS — F330 Major depressive disorder, recurrent, mild: Secondary | ICD-10-CM | POA: Diagnosis not present

## 2019-12-13 DIAGNOSIS — F411 Generalized anxiety disorder: Secondary | ICD-10-CM | POA: Diagnosis not present

## 2019-12-13 DIAGNOSIS — F330 Major depressive disorder, recurrent, mild: Secondary | ICD-10-CM | POA: Diagnosis not present

## 2019-12-20 DIAGNOSIS — F331 Major depressive disorder, recurrent, moderate: Secondary | ICD-10-CM | POA: Diagnosis not present

## 2019-12-20 DIAGNOSIS — F411 Generalized anxiety disorder: Secondary | ICD-10-CM | POA: Diagnosis not present

## 2019-12-27 DIAGNOSIS — F411 Generalized anxiety disorder: Secondary | ICD-10-CM | POA: Diagnosis not present

## 2020-01-03 DIAGNOSIS — F330 Major depressive disorder, recurrent, mild: Secondary | ICD-10-CM | POA: Diagnosis not present

## 2020-01-03 DIAGNOSIS — F411 Generalized anxiety disorder: Secondary | ICD-10-CM | POA: Diagnosis not present

## 2020-01-10 DIAGNOSIS — F411 Generalized anxiety disorder: Secondary | ICD-10-CM | POA: Diagnosis not present

## 2020-01-10 DIAGNOSIS — F330 Major depressive disorder, recurrent, mild: Secondary | ICD-10-CM | POA: Diagnosis not present

## 2020-01-24 DIAGNOSIS — F411 Generalized anxiety disorder: Secondary | ICD-10-CM | POA: Diagnosis not present

## 2020-01-30 DIAGNOSIS — B373 Candidiasis of vulva and vagina: Secondary | ICD-10-CM | POA: Diagnosis not present

## 2020-01-30 DIAGNOSIS — N760 Acute vaginitis: Secondary | ICD-10-CM | POA: Diagnosis not present

## 2020-01-31 DIAGNOSIS — F411 Generalized anxiety disorder: Secondary | ICD-10-CM | POA: Diagnosis not present

## 2020-02-07 DIAGNOSIS — F411 Generalized anxiety disorder: Secondary | ICD-10-CM | POA: Diagnosis not present

## 2020-02-14 DIAGNOSIS — F411 Generalized anxiety disorder: Secondary | ICD-10-CM | POA: Diagnosis not present

## 2020-02-21 DIAGNOSIS — F411 Generalized anxiety disorder: Secondary | ICD-10-CM | POA: Diagnosis not present

## 2020-03-06 DIAGNOSIS — F411 Generalized anxiety disorder: Secondary | ICD-10-CM | POA: Diagnosis not present

## 2020-03-15 DIAGNOSIS — F411 Generalized anxiety disorder: Secondary | ICD-10-CM | POA: Diagnosis not present

## 2020-03-20 DIAGNOSIS — F411 Generalized anxiety disorder: Secondary | ICD-10-CM | POA: Diagnosis not present

## 2020-03-27 DIAGNOSIS — F411 Generalized anxiety disorder: Secondary | ICD-10-CM | POA: Diagnosis not present

## 2020-04-04 DIAGNOSIS — F411 Generalized anxiety disorder: Secondary | ICD-10-CM | POA: Diagnosis not present

## 2020-04-10 DIAGNOSIS — F410 Panic disorder [episodic paroxysmal anxiety] without agoraphobia: Secondary | ICD-10-CM | POA: Diagnosis not present

## 2020-04-11 DIAGNOSIS — Z1231 Encounter for screening mammogram for malignant neoplasm of breast: Secondary | ICD-10-CM | POA: Diagnosis not present

## 2020-04-17 DIAGNOSIS — F411 Generalized anxiety disorder: Secondary | ICD-10-CM | POA: Diagnosis not present

## 2020-04-23 DIAGNOSIS — Z01419 Encounter for gynecological examination (general) (routine) without abnormal findings: Secondary | ICD-10-CM | POA: Diagnosis not present

## 2020-04-23 DIAGNOSIS — B009 Herpesviral infection, unspecified: Secondary | ICD-10-CM | POA: Diagnosis not present

## 2020-04-23 DIAGNOSIS — Z6834 Body mass index (BMI) 34.0-34.9, adult: Secondary | ICD-10-CM | POA: Diagnosis not present

## 2020-04-24 DIAGNOSIS — F411 Generalized anxiety disorder: Secondary | ICD-10-CM | POA: Diagnosis not present

## 2020-05-08 DIAGNOSIS — F410 Panic disorder [episodic paroxysmal anxiety] without agoraphobia: Secondary | ICD-10-CM | POA: Diagnosis not present

## 2020-05-14 ENCOUNTER — Emergency Department (HOSPITAL_BASED_OUTPATIENT_CLINIC_OR_DEPARTMENT_OTHER): Payer: BC Managed Care – PPO

## 2020-05-14 ENCOUNTER — Emergency Department (HOSPITAL_BASED_OUTPATIENT_CLINIC_OR_DEPARTMENT_OTHER)
Admission: EM | Admit: 2020-05-14 | Discharge: 2020-05-14 | Disposition: A | Discharge status: Home / Self Care | Payer: BC Managed Care – PPO | Attending: Emergency Medicine | Admitting: Emergency Medicine

## 2020-05-14 ENCOUNTER — Other Ambulatory Visit: Payer: Self-pay

## 2020-05-14 ENCOUNTER — Encounter (HOSPITAL_BASED_OUTPATIENT_CLINIC_OR_DEPARTMENT_OTHER): Payer: Self-pay | Admitting: Emergency Medicine

## 2020-05-14 DIAGNOSIS — M79662 Pain in left lower leg: Secondary | ICD-10-CM | POA: Diagnosis not present

## 2020-05-14 DIAGNOSIS — M79605 Pain in left leg: Secondary | ICD-10-CM | POA: Diagnosis not present

## 2020-05-14 DIAGNOSIS — S80212A Abrasion, left knee, initial encounter: Secondary | ICD-10-CM | POA: Diagnosis not present

## 2020-05-14 DIAGNOSIS — M25569 Pain in unspecified knee: Secondary | ICD-10-CM

## 2020-05-14 DIAGNOSIS — Y30XXXA Falling, jumping or pushed from a high place, undetermined intent, initial encounter: Secondary | ICD-10-CM | POA: Insufficient documentation

## 2020-05-14 DIAGNOSIS — Y9239 Other specified sports and athletic area as the place of occurrence of the external cause: Secondary | ICD-10-CM | POA: Diagnosis not present

## 2020-05-14 DIAGNOSIS — M25562 Pain in left knee: Secondary | ICD-10-CM | POA: Diagnosis not present

## 2020-05-14 DIAGNOSIS — S8992XA Unspecified injury of left lower leg, initial encounter: Secondary | ICD-10-CM | POA: Diagnosis not present

## 2020-05-14 DIAGNOSIS — Y9343 Activity, gymnastics: Secondary | ICD-10-CM | POA: Insufficient documentation

## 2020-05-14 MED ORDER — TETANUS-DIPHTH-ACELL PERTUSSIS 5-2.5-18.5 LF-MCG/0.5 IM SUSY
0.5000 mL | PREFILLED_SYRINGE | Freq: Once | INTRAMUSCULAR | Status: DC
  Filled 2020-05-14: qty 0.5

## 2020-05-14 NOTE — Discharge Instructions (Addendum)
At this time there does not appear to be the presence of an emergent medical condition, however there is always the potential for conditions to change. Please read and follow the below instructions.  Please return to the Emergency Department immediately for any new or worsening symptoms. Please be sure to follow up with your Primary Care Provider within one week regarding your visit today; please call their office to schedule an appointment even if you are feeling better for a follow-up visit. Please keep the area covered with a Band-Aid and you may apply a small amount of antibiotic ointment to the area for the next few days. Rest ice elevation and compression may help with your symptoms.  Go to the nearest Emergency Department immediately if: You have fever or chills You have a red streak going away from your wound. You have numbness/tingling or weakness You have fluid, blood, or pus coming from your wound. There is a bad smell coming from your wound or bandage. You have any new/concerning or worsening of symptoms   Please read the additional information packets attached to your discharge summary.  Do not take your medicine if  develop an itchy rash, swelling in your mouth or lips, or difficulty breathing; call 911 and seek immediate emergency medical attention if this occurs.  You may review your lab tests and imaging results in their entirety on your MyChart account.  Please discuss all results of fully with your primary care provider and other specialist at your follow-up visit.  Note: Portions of this text may have been transcribed using voice recognition software. Every effort was made to ensure accuracy; however, inadvertent computerized transcription errors may still be present.

## 2020-05-14 NOTE — ED Triage Notes (Signed)
Reports left knee pain after doing jump squats at the gym.  Reports felt like knee was abraded and the skin feels tight and stiff.

## 2020-05-14 NOTE — ED Provider Notes (Signed)
[facility] Provider Note   CSN: [id number] Arrival date & time: 05/14/20  1110     History Chief Complaint  Patient presents with  . Knee Pain    Kara Duffy is a 44 y.o. female history of DVT no longer on blood thinners, obesity.  Patient reports she was sent into the ER by her PCP for ultrasound of her left leg for concern of DVT.  She reports that 2 nights ago she was taking a shower and felt a burning pain of her left knee she reports that she looked down and saw a small abrasion to her left knee that she does not remember how it occurred.  She reports burning pain is mild only with touching the area improved with rest and pain does not radiate.  While she was looking at her knees she thought she saw a small amount of swelling just below it and is concerned for possible blood clot.  She called her PCP who informed her to come to this ER for evaluation.  Patient reports some increased pain with bending at the left knee she is not sure if this is from the abrasion she reports a tight feeling around her knee only with bending and improves with rest.  Patient reports that she is active and was doing jump squats last week but does not recall any injury at that time.  Denies fever/chills, chest pain/shortness of rebound pain, nausea/vomiting, arm/tingling, weakness or any additional concerns  Of note patient reports unknown last Tdap, requests update today.  HPI     Past Medical History:  Diagnosis Date  . Abnormal Pap smear    cin-I  . Allergy   . BV (bacterial vaginosis)    2 wks ago  . Clotting disorder ([place])   . DVT (deep venous thrombosis) ([place]) 2013   right leg - no meds currently  . DVT prophylaxis 07/10/2013  . Endometriosis   . H/O chlamydia infection   . H/O molluscum contagiosum   . Headache(784.0)    otc meds  . Herpes simplex without mention of complication   . No pertinent past medical history   . Pregnancy 07/10/2013     Patient Active Problem List   Diagnosis Date Noted  . Snoring 01/19/2019  . Chronic pelvic pain in female 04/13/2018  . History of DVT (deep vein thrombosis) 02/12/2014  . S/P cesarean section 01/03/2014  . Gestational hypertension 01/02/2014  . Advanced maternal age (AMA) in pregnancy 01/02/2014  . Herpes simplex type II infection 01/02/2014  . Anemia of other chronic disease 12/08/2013  . Leukocytosis, unspecified 12/08/2013  . Endometrioma of ovary 04/19/2012  . Anticoagulated by anticoagulation treatment 12/30/2011    Past Surgical History:  Procedure Laterality Date  . BUNIONECTOMY  2012  . CESAREAN SECTION N/A 01/03/2014   Procedure: CESAREAN SECTION;  Surgeon: Alinda Dooms, MD;  Location: [facility];  Service: Obstetrics;  Laterality: N/A;  . DILATION AND CURETTAGE OF UTERUS  2010  . LAPAROSCOPIC VAGINAL HYSTERECTOMY WITH SALPINGECTOMY Bilateral 04/13/2018   Procedure: LAPAROSCOPIC ASSISTED VAGINAL HYSTERECTOMY WITH SALPINGECTOMY;  Surgeon: Ena Dawley, MD;  Location[facility];  Service: Gynecology;  Laterality: Bilateral;  . LAPAROSCOPY  03/30/2012   Procedure: LAPAROSCOPY OPERATIVE;  Surgeon: Delice Lesch, MD;  Location: [facility];  Service: Gynecology;  Laterality: N/A;  . OVARIAN CYST REMOVAL  03/30/2012   Procedure: OVARIAN CYSTECTOMY;  Surgeon: Delice Lesch, MD;  Location: [facility];  Service: Gynecology;  Laterality: Left;     OB History    Gravida  2   Para  1   Term  1   Preterm      AB      Living  1     SAB      IAB      Ectopic      Multiple      Live Births  1           Family History  Problem Relation Age of Onset  . Hypertension Mother   . Diabetes Mother   . Deep vein thrombosis Mother   . Hypertension Father   . Hyperlipidemia Father   . Diabetes Paternal Uncle   . Diabetes Paternal Grandmother   . Diabetes Paternal Uncle   . Diabetes Cousin     Social History   Tobacco Use  . Smoking status:  Never Smoker  . Smokeless tobacco: Never Used  Vaping Use  . Vaping Use: Never used  Substance Use Topics  . Alcohol use: Yes    Comment: 4 times per week   . Drug use: No    Home Medications Prior to Admission medications   Medication Sig Start Date End Date Taking? Authorizing Provider  amLODipine (NORVASC) 5 MG tablet Take 1 tablet by mouth daily.    [provider]  cetirizine (ZYRTEC) 10 MG tablet Take 5 mg by mouth daily.    [provider]  Cholecalciferol (VITAMIN D-3) 25 MCG (1000 UT) CAPS Take 3,000 Units by mouth daily.    [provider]  fluticasone (FLONASE) 50 MCG/ACT nasal spray Place 1 spray into both nostrils at bedtime. 02/07/19   Oretha Milch, MD  ibuprofen (ADVIL,MOTRIN) 800 MG tablet Take 1 tablet (800 mg total) by mouth every 8 (eight) hours as needed. 04/14/18   Kirkland [facility], MD  Multiple Vitamins-Minerals (EMERGEN-C VITAMIN C PO) Take 1 tablet by mouth daily as needed (for immune system).    [provider]  valACYclovir (VALTREX) 500 MG tablet Take 500 mg by mouth daily. 11/10/13   [provider]    Allergies    Patient has no known allergies.  Review of Systems   Review of Systems Ten systems are reviewed and are negative for acute change except as noted in the HPI  Physical Exam Updated Vital Signs BP (!) 137/94 (BP Location: Left Arm)   Pulse 89   Temp 98.6 F (37 C) (Oral)   Resp 18   Ht 5\' 6"  (1.676 m)   Wt 90.7 kg   LMP 03/30/2014 (Exact Date)   SpO2 99%   BMI 32.28 kg/m   Physical Exam Constitutional:      General: She is not in acute distress.    Appearance: Normal appearance. She is well-developed. She is not ill-appearing or diaphoretic.  HENT:     Head: Normocephalic and atraumatic.  Eyes:     General: Vision grossly intact. Gaze aligned appropriately.     Pupils: Pupils are equal, round, and reactive to light.  Neck:     Trachea: Trachea and phonation normal.  Cardiovascular:      Rate and Rhythm: Normal rate and regular rhythm.     Pulses:          Dorsalis pedis pulses are 2+ on the right side and 2+ on the left side.  Pulmonary:     Effort: Pulmonary effort is normal. No respiratory distress.  Abdominal:     General: There  is no distension.     Palpations: Abdomen is soft.     Tenderness: There is no abdominal tenderness. There is no guarding or rebound.  Musculoskeletal:        General: Normal range of motion.     Cervical back: Normal range of motion.     Comments: Right Knee: Small superficial abrasion present to the lateral portion of the left patella as seen in picture below. Otherwise appearance normal. No obvious deformity. No skin swelling, erythema, heat, fluctuance or break of the skin. Minimal tenderness just over the abrasion no other pain. Active range of motion with extension and flexion intact at the left knee with only some mild pain around the abrasion with flexion. Negative anterior/poster drawer bilaterally. Negative ballottement test. No varus or valgus laxity or locking. No tenderness to palpation of hips or ankles.Compartments soft. Neurovascularly intact distally to site of injury. Full range of motion at the hip ankle and foot without pain. Legs appear symmetric on exam. Of note the picture has some shadowing over the right knee from camera.  Feet:     Right foot:     Protective Sensation: 5 sites tested. 5 sites sensed.     Skin integrity: Skin integrity normal.     Left foot:     Protective Sensation: 5 sites tested. 5 sites sensed.     Skin integrity: Skin integrity normal.  Skin:    General: Skin is warm and dry.  Neurological:     Mental Status: She is alert.     GCS: GCS eye subscore is 4. GCS verbal subscore is 5. GCS motor subscore is 6.     Comments: Speech is clear and goal oriented, follows commands Major Cranial nerves without deficit, no facial droop Moves extremities without ataxia, coordination intact  Psychiatric:         Behavior: Behavior normal.       ED Results / Procedures / Treatments   Labs (all labs ordered are listed, but only abnormal results are displayed) Labs Reviewed - No data to display  EKG None  Radiology US Venous Img Lower Unilateral Left (DVT)  Result Date: 05/14/2020 CLINICAL DATA:  Left lower extremity pain EXAM: LEFT LOWER EXTREMITY VENOUS DOPPLER ULTRASOUND TECHNIQUE: Gray-scale sonography with graded compression, as well as color Doppler and duplex ultrasound were performed to evaluate the lower extremity deep venous systems from the level of the common femoral vein and including the common femoral, femoral, profunda femoral, popliteal and calf veins including the posterior tibial, peroneal and gastrocnemius veins when visible. The superficial great saphenous vein was also interrogated. Spectral Doppler was utilized to evaluate flow at rest and with distal augmentation maneuvers in the common femoral, femoral and popliteal veins. COMPARISON:  None. FINDINGS: Contralateral Common Femoral Vein: Respiratory phasicity is normal and symmetric with the symptomatic side. No evidence of thrombus. Normal compressibility. Common Femoral Vein: No evidence of thrombus. Normal compressibility, respiratory phasicity and response to augmentation. Saphenofemoral Junction: No evidence of thrombus. Normal compressibility and flow on color Doppler imaging. Profunda Femoral Vein: No evidence of thrombus. Normal compressibility and flow on color Doppler imaging. Femoral Vein: No evidence of thrombus. Normal compressibility, respiratory phasicity and response to augmentation. Popliteal Vein: No evidence of thrombus. Normal compressibility, respiratory phasicity and response to augmentation. Calf Veins: No evidence of thrombus. Normal compressibility and flow on color Doppler imaging. Superficial Great Saphenous Vein: No evidence of thrombus. Normal compressibility. IMPRESSION: No evidence of deep venous  thrombosis. Electronically Signed  By: Eugenie Filler M.D.   On: 05/14/2020 12:30   DG Knee Complete 4 Views Left  Result Date: 05/14/2020 CLINICAL DATA:  Knee pain.  History of DVT. EXAM: LEFT KNEE - COMPLETE 4+ VIEW COMPARISON:  No prior. FINDINGS: No acute bony or joint abnormality. No evidence of fracture dislocation. No effusion noted. IMPRESSION: No acute abnormality. Electronically Signed   By: Marcello Moores  Register   On: 05/14/2020 12:12    Procedures Procedures (including critical care time)  Medications Ordered in ED Medications  Tdap (BOOSTRIX) injection 0.5 mL (has no administration in time range)    ED Course  I have reviewed the triage vital signs and the nursing notes.  Pertinent labs & imaging results that were available during my care of the patient were reviewed by me and considered in my medical decision making (see chart for details).    MDM Rules/Calculators/A&P                         Additional history obtained from: 1. Nursing notes from this visit. ----------------- DG Left Knee:  IMPRESSION:  No acute abnormality.   Left LE DVT US:  IMPRESSION:  No evidence of deep venous thrombosis.   Suspect patient's pain today is secondary to a healing abrasion over the left patella. There is no evidence of swelling on exam. Work-up above is reassuring. There is no evidence of cellulitis, DVT, septic arthritis, compartment syndrome, fracture/dislocation or neurovascular compromise. I offered patient crutches today and she refused. I encourage patient to follow-up with her PCP for recheck. I discussed basic rice therapy and abrasion home care. Encourage patient to keep the area covered with a Band-Aid use some amount of antibiotic ointment for the next few days and monitor for signs of infection. There is no indication for oral antibiotics or further work-up at this time. Patient also aware of limitations of x-ray imaging today and that soft tissue injury such as ligamentous and  tendon or meniscal injury could be present however given history and exam lower suspicion for those pathologies at this time there is no gross ligamentous laxity on exam suspect skin abrasion to be because of pain today. Patient encouraged to call PCPs office today to schedule follow-up appointment. Tdap updated per patient request.  At this time there does not appear to be any evidence of an acute emergency medical condition and the patient appears stable for discharge with appropriate outpatient follow up. Diagnosis was discussed with patient who verbalizes understanding of care plan and is agreeable to discharge. I have discussed return precautions with patient who verbalizes understanding. Patient encouraged to follow-up with their PCP. All questions answered.  Patient's case discussed with Dr. Laverta Baltimore who agrees with plan to discharge with follow-up.   Note: Portions of this report may have been transcribed using voice recognition software. Every effort was made to ensure accuracy; however, inadvertent computerized transcription errors may still be present. Final Clinical Impression(s) / ED Diagnoses Final diagnoses:  Abrasion of left knee, initial encounter    Rx / DC Orders ED Discharge Orders    None       Gari Crown 05/14/20 1312    LongWonda Olds, MD 05/20/20 [phone]

## 2020-05-15 DIAGNOSIS — F411 Generalized anxiety disorder: Secondary | ICD-10-CM | POA: Diagnosis not present

## 2020-05-20 DIAGNOSIS — E559 Vitamin D deficiency, unspecified: Secondary | ICD-10-CM | POA: Diagnosis not present

## 2020-05-20 DIAGNOSIS — I10 Essential (primary) hypertension: Secondary | ICD-10-CM | POA: Diagnosis not present

## 2020-05-20 DIAGNOSIS — Z0000 Encounter for general adult medical examination without abnormal findings: Secondary | ICD-10-CM | POA: Diagnosis not present

## 2020-05-20 DIAGNOSIS — R030 Elevated blood-pressure reading, without diagnosis of hypertension: Secondary | ICD-10-CM | POA: Diagnosis not present

## 2020-05-22 DIAGNOSIS — F411 Generalized anxiety disorder: Secondary | ICD-10-CM | POA: Diagnosis not present

## 2020-05-27 DIAGNOSIS — I10 Essential (primary) hypertension: Secondary | ICD-10-CM | POA: Diagnosis not present

## 2020-05-27 DIAGNOSIS — Z86718 Personal history of other venous thrombosis and embolism: Secondary | ICD-10-CM | POA: Diagnosis not present

## 2020-05-27 DIAGNOSIS — Z8669 Personal history of other diseases of the nervous system and sense organs: Secondary | ICD-10-CM | POA: Diagnosis not present

## 2020-05-27 DIAGNOSIS — Z0000 Encounter for general adult medical examination without abnormal findings: Secondary | ICD-10-CM | POA: Diagnosis not present

## 2020-05-31 DIAGNOSIS — F411 Generalized anxiety disorder: Secondary | ICD-10-CM | POA: Diagnosis not present

## 2020-06-05 DIAGNOSIS — F411 Generalized anxiety disorder: Secondary | ICD-10-CM | POA: Diagnosis not present

## 2020-06-12 DIAGNOSIS — F411 Generalized anxiety disorder: Secondary | ICD-10-CM | POA: Diagnosis not present

## 2020-06-19 DIAGNOSIS — F411 Generalized anxiety disorder: Secondary | ICD-10-CM | POA: Diagnosis not present

## 2020-07-03 DIAGNOSIS — F411 Generalized anxiety disorder: Secondary | ICD-10-CM | POA: Diagnosis not present

## 2020-07-11 DIAGNOSIS — F411 Generalized anxiety disorder: Secondary | ICD-10-CM | POA: Diagnosis not present

## 2020-07-17 DIAGNOSIS — F411 Generalized anxiety disorder: Secondary | ICD-10-CM | POA: Diagnosis not present

## 2020-07-24 DIAGNOSIS — F411 Generalized anxiety disorder: Secondary | ICD-10-CM | POA: Diagnosis not present

## 2020-07-31 DIAGNOSIS — F411 Generalized anxiety disorder: Secondary | ICD-10-CM | POA: Diagnosis not present

## 2020-08-07 DIAGNOSIS — F411 Generalized anxiety disorder: Secondary | ICD-10-CM | POA: Diagnosis not present

## 2020-08-15 DIAGNOSIS — F411 Generalized anxiety disorder: Secondary | ICD-10-CM | POA: Diagnosis not present

## 2020-09-11 DIAGNOSIS — F411 Generalized anxiety disorder: Secondary | ICD-10-CM | POA: Diagnosis not present

## 2020-09-18 DIAGNOSIS — F411 Generalized anxiety disorder: Secondary | ICD-10-CM | POA: Diagnosis not present

## 2020-09-26 DIAGNOSIS — F411 Generalized anxiety disorder: Secondary | ICD-10-CM | POA: Diagnosis not present

## 2020-10-02 DIAGNOSIS — F411 Generalized anxiety disorder: Secondary | ICD-10-CM | POA: Diagnosis not present

## 2020-10-16 DIAGNOSIS — F411 Generalized anxiety disorder: Secondary | ICD-10-CM | POA: Diagnosis not present

## 2020-10-23 DIAGNOSIS — F411 Generalized anxiety disorder: Secondary | ICD-10-CM | POA: Diagnosis not present

## 2020-10-30 DIAGNOSIS — F411 Generalized anxiety disorder: Secondary | ICD-10-CM | POA: Diagnosis not present

## 2020-11-11 DIAGNOSIS — F411 Generalized anxiety disorder: Secondary | ICD-10-CM | POA: Diagnosis not present

## 2020-11-18 DIAGNOSIS — F411 Generalized anxiety disorder: Secondary | ICD-10-CM | POA: Diagnosis not present

## 2020-11-23 DIAGNOSIS — Z1159 Encounter for screening for other viral diseases: Secondary | ICD-10-CM | POA: Diagnosis not present

## 2020-11-23 DIAGNOSIS — J069 Acute upper respiratory infection, unspecified: Secondary | ICD-10-CM | POA: Diagnosis not present

## 2020-12-04 DIAGNOSIS — F411 Generalized anxiety disorder: Secondary | ICD-10-CM | POA: Diagnosis not present

## 2020-12-18 DIAGNOSIS — Z20822 Contact with and (suspected) exposure to covid-19: Secondary | ICD-10-CM | POA: Diagnosis not present

## 2020-12-18 DIAGNOSIS — Z6835 Body mass index (BMI) 35.0-35.9, adult: Secondary | ICD-10-CM | POA: Diagnosis not present

## 2020-12-31 DIAGNOSIS — F411 Generalized anxiety disorder: Secondary | ICD-10-CM | POA: Diagnosis not present

## 2021-01-06 DIAGNOSIS — F411 Generalized anxiety disorder: Secondary | ICD-10-CM | POA: Diagnosis not present

## 2021-03-19 DIAGNOSIS — F411 Generalized anxiety disorder: Secondary | ICD-10-CM | POA: Diagnosis not present

## 2021-03-25 DIAGNOSIS — F411 Generalized anxiety disorder: Secondary | ICD-10-CM | POA: Diagnosis not present

## 2021-04-01 DIAGNOSIS — F411 Generalized anxiety disorder: Secondary | ICD-10-CM | POA: Diagnosis not present

## 2021-04-07 DIAGNOSIS — M25571 Pain in right ankle and joints of right foot: Secondary | ICD-10-CM | POA: Diagnosis not present

## 2021-04-07 DIAGNOSIS — G8929 Other chronic pain: Secondary | ICD-10-CM | POA: Diagnosis not present

## 2021-04-23 DIAGNOSIS — Z01419 Encounter for gynecological examination (general) (routine) without abnormal findings: Secondary | ICD-10-CM | POA: Diagnosis not present

## 2021-04-23 DIAGNOSIS — Z113 Encounter for screening for infections with a predominantly sexual mode of transmission: Secondary | ICD-10-CM | POA: Diagnosis not present

## 2021-04-23 DIAGNOSIS — R32 Unspecified urinary incontinence: Secondary | ICD-10-CM | POA: Diagnosis not present

## 2021-04-23 DIAGNOSIS — Z6836 Body mass index (BMI) 36.0-36.9, adult: Secondary | ICD-10-CM | POA: Diagnosis not present

## 2021-04-23 DIAGNOSIS — Z1231 Encounter for screening mammogram for malignant neoplasm of breast: Secondary | ICD-10-CM | POA: Diagnosis not present

## 2021-04-29 DIAGNOSIS — R059 Cough, unspecified: Secondary | ICD-10-CM | POA: Diagnosis not present

## 2021-04-29 DIAGNOSIS — R070 Pain in throat: Secondary | ICD-10-CM | POA: Diagnosis not present

## 2021-04-29 DIAGNOSIS — J040 Acute laryngitis: Secondary | ICD-10-CM | POA: Diagnosis not present

## 2021-04-30 DIAGNOSIS — J45909 Unspecified asthma, uncomplicated: Secondary | ICD-10-CM | POA: Diagnosis not present

## 2021-04-30 DIAGNOSIS — L218 Other seborrheic dermatitis: Secondary | ICD-10-CM | POA: Diagnosis not present

## 2021-06-02 DIAGNOSIS — E559 Vitamin D deficiency, unspecified: Secondary | ICD-10-CM | POA: Diagnosis not present

## 2021-06-02 DIAGNOSIS — Z0000 Encounter for general adult medical examination without abnormal findings: Secondary | ICD-10-CM | POA: Diagnosis not present

## 2021-06-03 DIAGNOSIS — Z0000 Encounter for general adult medical examination without abnormal findings: Secondary | ICD-10-CM | POA: Diagnosis not present

## 2021-06-10 DIAGNOSIS — F411 Generalized anxiety disorder: Secondary | ICD-10-CM | POA: Diagnosis not present

## 2021-06-17 DIAGNOSIS — F411 Generalized anxiety disorder: Secondary | ICD-10-CM | POA: Diagnosis not present

## 2021-07-29 DIAGNOSIS — H16041 Marginal corneal ulcer, right eye: Secondary | ICD-10-CM | POA: Diagnosis not present

## 2021-08-11 DIAGNOSIS — H16041 Marginal corneal ulcer, right eye: Secondary | ICD-10-CM | POA: Diagnosis not present

## 2021-08-25 DIAGNOSIS — H16041 Marginal corneal ulcer, right eye: Secondary | ICD-10-CM | POA: Diagnosis not present

## 2021-10-11 ENCOUNTER — Ambulatory Visit
Admission: EM | Admit: 2021-10-11 | Discharge: 2021-10-11 | Disposition: A | Discharge status: Home / Self Care | Payer: BC Managed Care – PPO | Attending: Internal Medicine | Admitting: Internal Medicine

## 2021-10-11 DIAGNOSIS — H5789 Other specified disorders of eye and adnexa: Secondary | ICD-10-CM

## 2021-10-11 MED ORDER — ERYTHROMYCIN 5 MG/GM OP OINT: TOPICAL_OINTMENT | OPHTHALMIC | 0 refills | Status: AC

## 2021-10-11 MED ORDER — METHYLPREDNISOLONE SODIUM SUCC 125 MG IJ SOLR
80.0000 mg | Freq: Once | INTRAMUSCULAR | Status: AC
  Administered 2021-10-11: 80 mg via INTRAMUSCULAR

## 2021-10-11 NOTE — ED Triage Notes (Signed)
Pt c/o left periorbital edema. Concerned for rxn to fluconazole.

## 2021-10-11 NOTE — Discharge Instructions (Signed)
You were given a steroid injection in case allergic reaction is present.  It also is concerning that you could have a stye in your eye so you been prescribed an antibiotic ointment for this.  Please continue warm compresses.  Follow-up with eye doctor if symptoms persist or worsen.

## 2021-10-11 NOTE — ED Provider Notes (Signed)
[facility]    CSN: [id number] Arrival date & time: 10/11/21  1429      History   Chief Complaint Chief Complaint  Patient presents with   left eye swelling    HPI Kara Duffy is a 46 y.o. female.   Patient presents with left lower eyelid swelling that started yesterday.  Denies any recent change of environment including lotions, soaps, detergents, foods, etc.  She does report that she started taking Diflucan and has been the only change.  Eye is slightly painful but not itchy.  Denies any drainage to the eye.  Denies trauma or foreign body to the eye.  Patient does wear contacts and glasses but patient reports that she has not worn her contacts in several months.    Past Medical History:  Diagnosis Date   Abnormal Pap smear    cin-I   Allergy    BV (bacterial vaginosis)    2 wks ago   Clotting disorder ([facility])    DVT (deep venous thrombosis) ([place]) 2013   right leg - no meds currently   DVT prophylaxis 07/10/2013   Endometriosis    H/O chlamydia infection    H/O molluscum contagiosum    Headache(784.0)    otc meds   Herpes simplex without mention of complication    No pertinent past medical history    Pregnancy 07/10/2013    Patient Active Problem List   Diagnosis Date Noted   Snoring 01/19/2019   Chronic pelvic pain in female 04/13/2018   History of DVT (deep vein thrombosis) 02/12/2014   S/P cesarean section 01/03/2014   Gestational hypertension 01/02/2014   Advanced maternal age (AMA) in pregnancy 01/02/2014   Herpes simplex type II infection 01/02/2014   Anemia of other chronic disease 12/08/2013   Leukocytosis, unspecified 12/08/2013   Endometrioma of ovary 04/19/2012   Anticoagulated by anticoagulation treatment 12/30/2011    Past Surgical History:  Procedure Laterality Date   BUNIONECTOMY  2012   CESAREAN SECTION N/A 01/03/2014   Procedure: CESAREAN SECTION;  Surgeon: Alinda Dooms, MD;  Location: [facility];  Service: Obstetrics;   Laterality: N/A;   DILATION AND CURETTAGE OF UTERUS  2010   LAPAROSCOPIC VAGINAL HYSTERECTOMY WITH SALPINGECTOMY Bilateral 04/13/2018   Procedure: LAPAROSCOPIC ASSISTED VAGINAL HYSTERECTOMY WITH SALPINGECTOMY;  Surgeon: Ena Dawley, MD;  Location: [facility];  Service: Gynecology;  Laterality: Bilateral;   LAPAROSCOPY  03/30/2012   Procedure: LAPAROSCOPY OPERATIVE;  Surgeon: Delice Lesch, MD;  Location: [facility];  Service: Gynecology;  Laterality: N/A;   OVARIAN CYST REMOVAL  03/30/2012   Procedure: OVARIAN CYSTECTOMY;  Surgeon: Delice Lesch, MD;  Location: [facility];  Service: Gynecology;  Laterality: Left;    OB History     Gravida  2   Para  1   Term  1   Preterm      AB      Living  1      SAB      IAB      Ectopic      Multiple      Live Births  1            Home Medications    Prior to Admission medications   Medication Sig Start Date End Date Taking? Authorizing Provider  erythromycin ophthalmic ointment Place a 1/2 inch ribbon of ointment into the lower eyelid 4 times daily for 7 days. 10/11/21  Yes Ajanee Buren, Michele Rockers, FNP  amLODipine (NORVASC) 5  MG tablet Take 1 tablet by mouth daily.    [provider]  cetirizine (ZYRTEC) 10 MG tablet Take 5 mg by mouth daily.    [provider]  Cholecalciferol (VITAMIN D-3) 25 MCG (1000 UT) CAPS Take 3,000 Units by mouth daily.    [provider]  fluticasone (FLONASE) 50 MCG/ACT nasal spray Place 1 spray into both nostrils at bedtime. 02/07/19   Rigoberto Noel, MD  ibuprofen (ADVIL,MOTRIN) 800 MG tablet Take 1 tablet (800 mg total) by mouth every 8 (eight) hours as needed. 04/14/18   Ena Dawley, MD  Multiple Vitamins-Minerals (EMERGEN-C VITAMIN C PO) Take 1 tablet by mouth daily as needed (for immune system).    [provider]  valACYclovir (VALTREX) 500 MG tablet Take 500 mg by mouth daily. 11/10/13   [provider]    Family History Family  History  Problem Relation Age of Onset   Hypertension Mother    Diabetes Mother    Deep vein thrombosis Mother    Hypertension Father    Hyperlipidemia Father    Diabetes Paternal Uncle    Diabetes Paternal Grandmother    Diabetes Paternal Uncle    Diabetes Cousin     Social History Social History   Tobacco Use   Smoking status: Never   Smokeless tobacco: Never  Vaping Use   Vaping Use: Never used  Substance Use Topics   Alcohol use: Yes    Comment: 4 times per week    Drug use: No     Allergies   Patient has no known allergies.   Review of Systems Review of Systems Per HPI  Physical Exam Triage Vital Signs ED Triage Vitals [10/11/21 1501]  Enc Vitals Group     BP 121/84     Pulse Rate 80     Resp 18     Temp 98.1 F (36.7 C)     Temp Source Oral     SpO2 98 %     Weight      Height      Head Circumference      Peak Flow      Pain Score 0     Pain Loc      Pain Edu?      Excl. in [place]?    No data found.  Updated Vital Signs BP 121/84 (BP Location: Left Arm)   Pulse 80   Temp 98.1 F (36.7 C) (Oral)   Resp 18   LMP 03/30/2014 (Exact Date)   SpO2 98%   Breastfeeding No   Visual Acuity Right Eye Distance:   Left Eye Distance:   Bilateral Distance:    Right Eye Near:   Left Eye Near:    Bilateral Near:     Physical Exam Constitutional:      General: She is not in acute distress.    Appearance: Normal appearance. She is not toxic-appearing or diaphoretic.  HENT:     Head: Normocephalic and atraumatic.  Eyes:     General: Lids are everted, no foreign bodies appreciated. Vision grossly intact. Gaze aligned appropriately.     Extraocular Movements: Extraocular movements intact.     Conjunctiva/sclera: Conjunctivae normal.     Pupils: Pupils are equal, round, and reactive to light.     Comments: Mild left lower lid swelling.  No obvious stye.  No drainage.  No other abnormalities noted to the eye.  Pulmonary:     Effort: Pulmonary effort  is normal.  Neurological:  General: No focal deficit present.     Mental Status: She is alert and oriented to person, place, and time. Mental status is at baseline.  Psychiatric:        Mood and Affect: Mood normal.        Behavior: Behavior normal.        Thought Content: Thought content normal.        Judgment: Judgment normal.     UC Treatments / Results  Labs (all labs ordered are listed, but only abnormal results are displayed) Labs Reviewed - No data to display  EKG   Radiology No results found.  Procedures Procedures (including critical care time)  Medications Ordered in UC Medications  methylPREDNISolone sodium succinate (SOLU-MEDROL) 125 mg/2 mL injection 80 mg (80 mg Intramuscular Given 10/11/21 1529)    Initial Impression / Assessment and Plan / UC Course  I have reviewed the triage vital signs and the nursing notes.  Pertinent labs & imaging results that were available during my care of the patient were reviewed by me and considered in my medical decision making (see chart for details).     Differential diagnoses include allergic reaction versus stye.  No obvious stye noted on exam but may not be visible.  No concern for orbital cellulitis or preseptal cellulitis.  Will treat for both differential diagnoses with 80 mg IM Solu-Medrol as well as erythromycin ointment with warm compresses.  Patient to follow-up with established eye doctor if symptoms persist or worsen.  Visual acuity appears normal.  No obvious contraindications to steroids at this time.  Discussed strict return and ER precautions.  Patient verbalized understanding and was agreeable with plan. Final Clinical Impressions(s) / UC Diagnoses   Final diagnoses:  Eye swelling, left     Discharge Instructions      You were given a steroid injection in case allergic reaction is present.  It also is concerning that you could have a stye in your eye so you been prescribed an antibiotic ointment for  this.  Please continue warm compresses.  Follow-up with eye doctor if symptoms persist or worsen.    ED Prescriptions     Medication Sig Dispense Auth. Provider   erythromycin ophthalmic ointment Place a 1/2 inch ribbon of ointment into the lower eyelid 4 times daily for 7 days. 3.5 g Teodora Medici, [place]      PDMP not reviewed this encounter.   Teodora Medici, [place] 10/11/21 1558

## 2021-11-17 DIAGNOSIS — F411 Generalized anxiety disorder: Secondary | ICD-10-CM | POA: Diagnosis not present

## 2021-11-20 DIAGNOSIS — Z1211 Encounter for screening for malignant neoplasm of colon: Secondary | ICD-10-CM | POA: Diagnosis not present

## 2021-11-26 DIAGNOSIS — F411 Generalized anxiety disorder: Secondary | ICD-10-CM | POA: Diagnosis not present

## 2021-12-01 DIAGNOSIS — F411 Generalized anxiety disorder: Secondary | ICD-10-CM | POA: Diagnosis not present

## 2021-12-09 DIAGNOSIS — F411 Generalized anxiety disorder: Secondary | ICD-10-CM | POA: Diagnosis not present

## 2021-12-12 IMAGING — US US EXTREM LOW VENOUS*L*
1 series · 13 of 24 positions shown · non-contrast
Comparison: None.

CLINICAL DATA: Left lower extremity pain



[Series 1: us extrem low venous*left* · 13 of 40 slices shown]
[im 1/40]
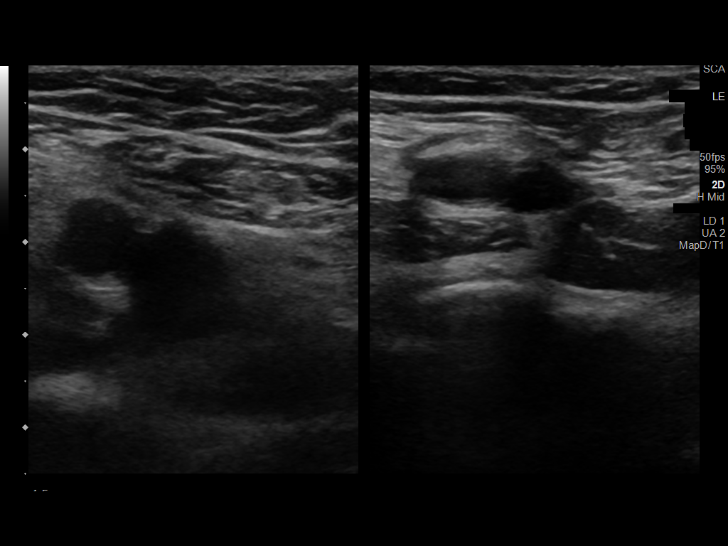
[im 4/40]
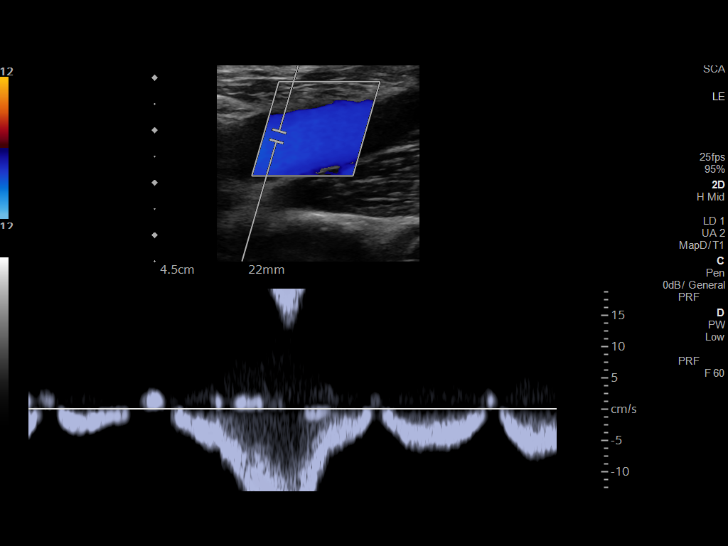
[im 7/40]
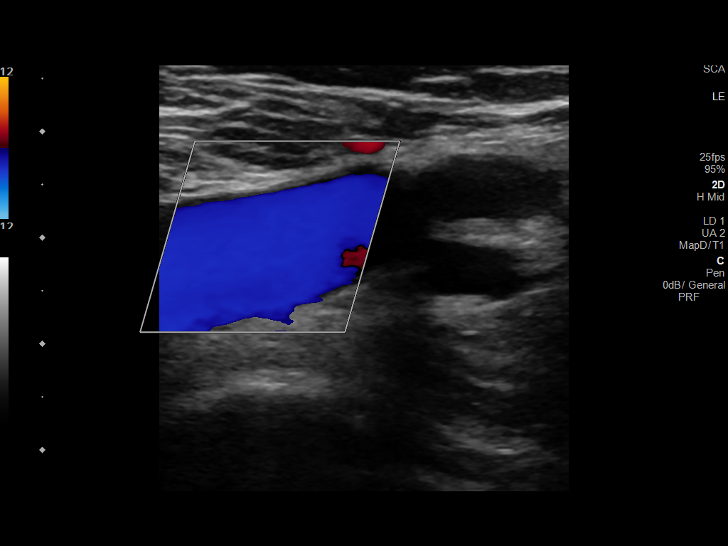
[im 11/40]
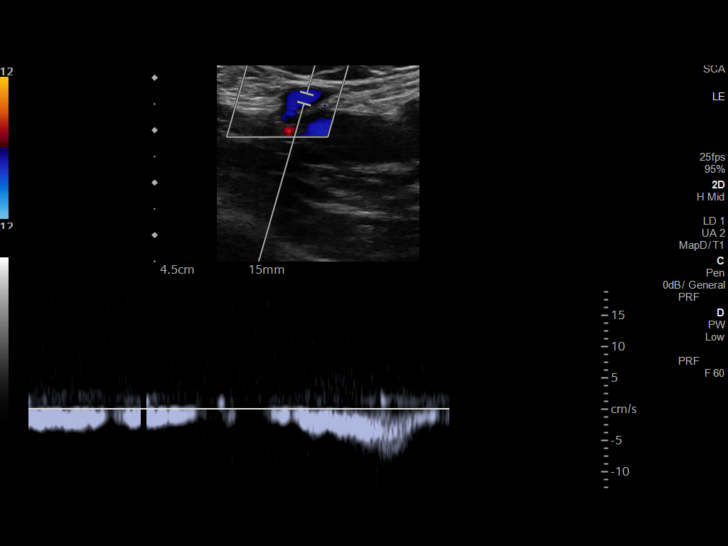
[im 14/40]
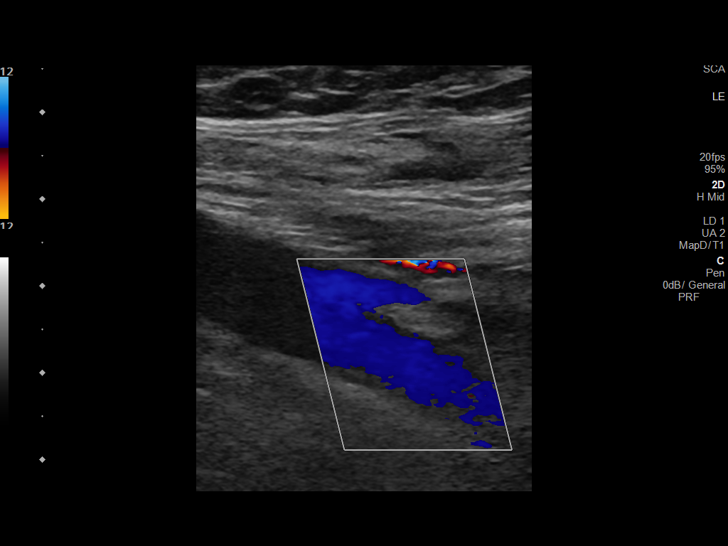
[im 17/40]
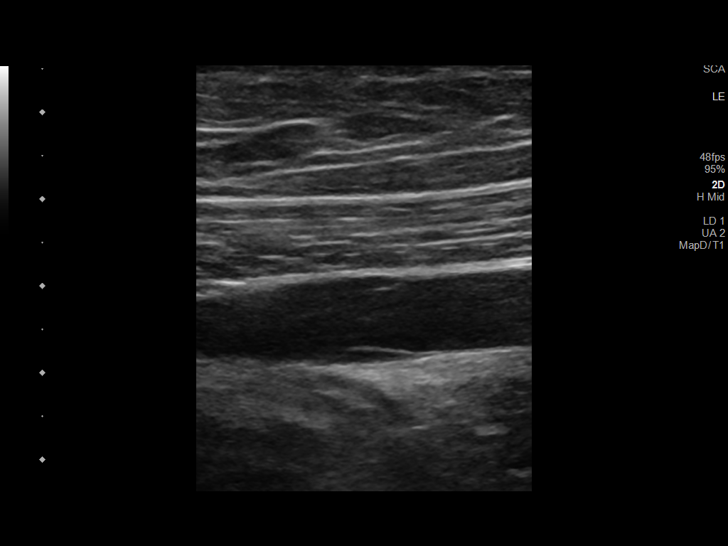
[im 21/40]
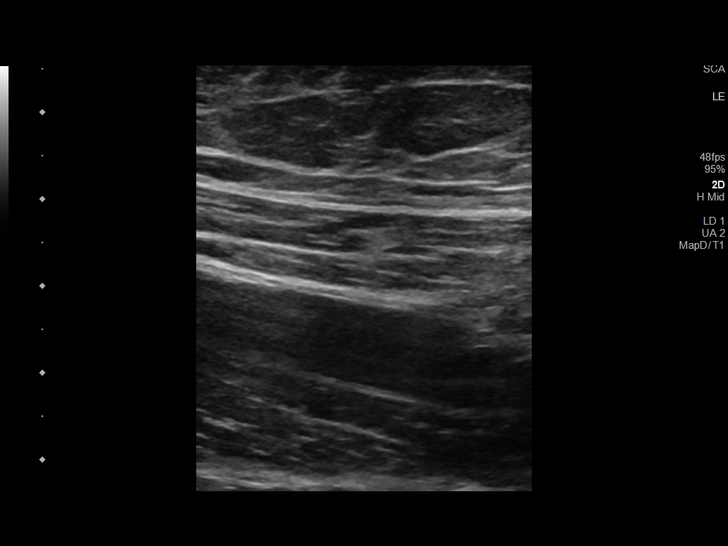
[im 23/40]
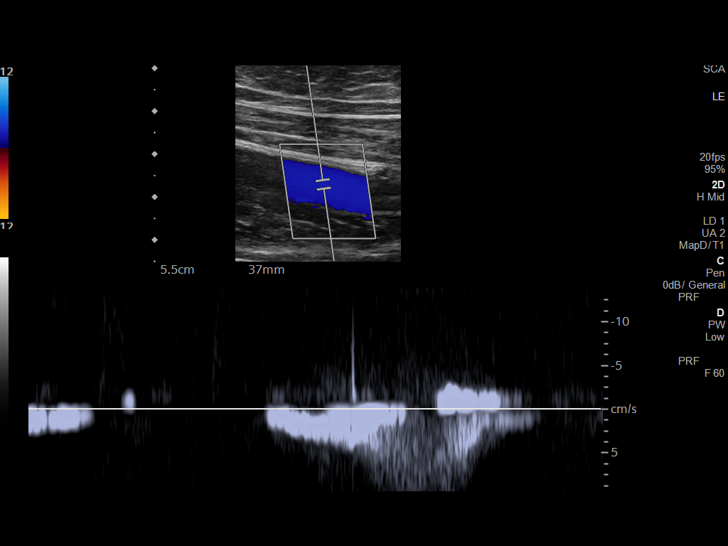
[im 26/40]
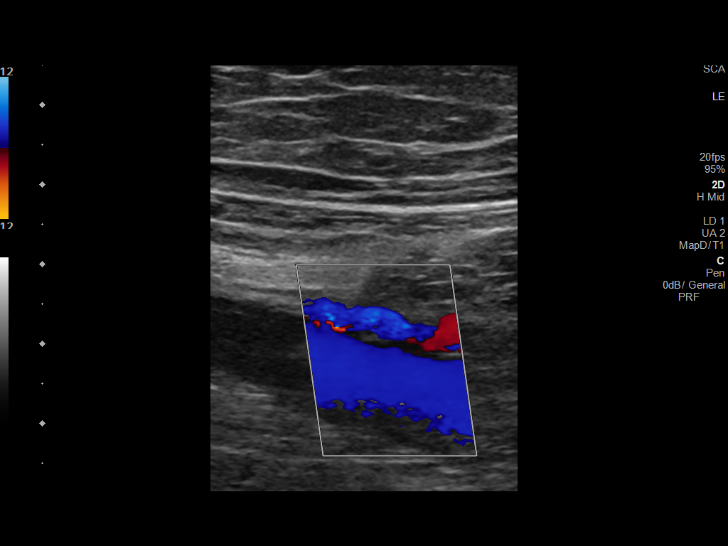
[im 29/40]
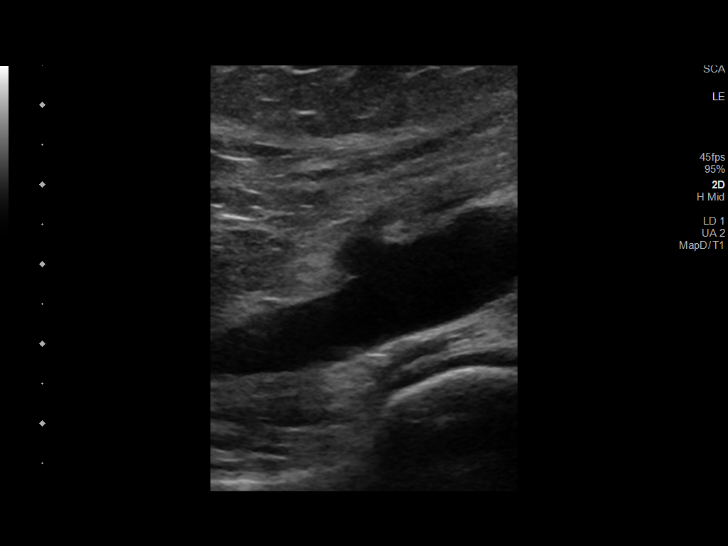
[im 33/40]
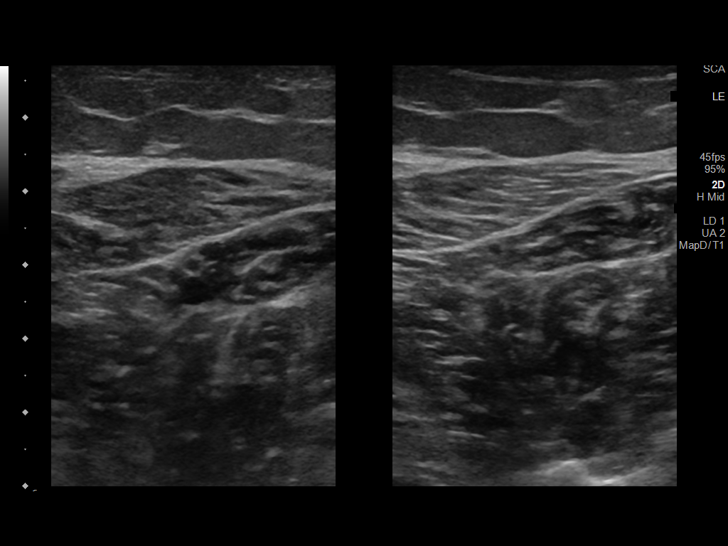
[im 36/40]
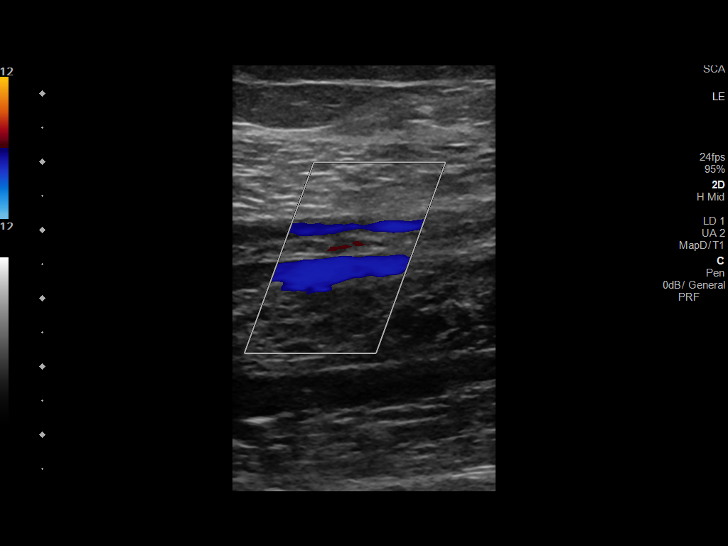
[im 40/40]
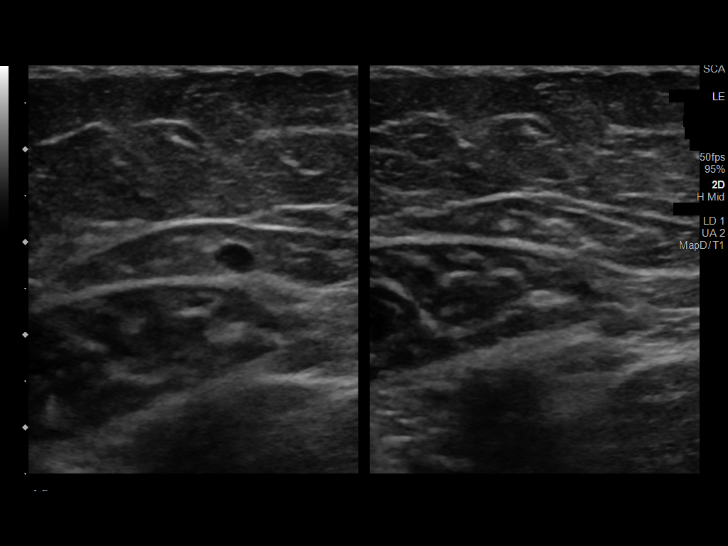

[13 of 24 positions shown; findings below may reference images not displayed]

FINDINGS: Contralateral Common Femoral Vein: Respiratory phasicity is normal
and symmetric with the symptomatic side. No evidence of thrombus.
Normal compressibility.

Common Femoral Vein: No evidence of thrombus. Normal
compressibility, respiratory phasicity and response to augmentation.

Saphenofemoral Junction: No evidence of thrombus. Normal
compressibility and flow on color Doppler imaging.

Profunda Femoral Vein: No evidence of thrombus. Normal
compressibility and flow on color Doppler imaging.

Femoral Vein: No evidence of thrombus. Normal compressibility,
respiratory phasicity and response to augmentation.

Popliteal Vein: No evidence of thrombus. Normal compressibility,
respiratory phasicity and response to augmentation.

Calf Veins: No evidence of thrombus. Normal compressibility and flow
on color Doppler imaging.

Superficial Great Saphenous Vein: No evidence of thrombus. Normal
compressibility.
IMPRESSION: No evidence of deep venous thrombosis.

## 2021-12-12 IMAGING — CR DG KNEE COMPLETE 4+V*L*
4 series · 4 of 4 positions shown · non-contrast
Comparison: No prior.

CLINICAL DATA: Knee pain.  History of DVT.

EXAM:
LEFT KNEE - COMPLETE 4+ VIEW

[t knee ap left]
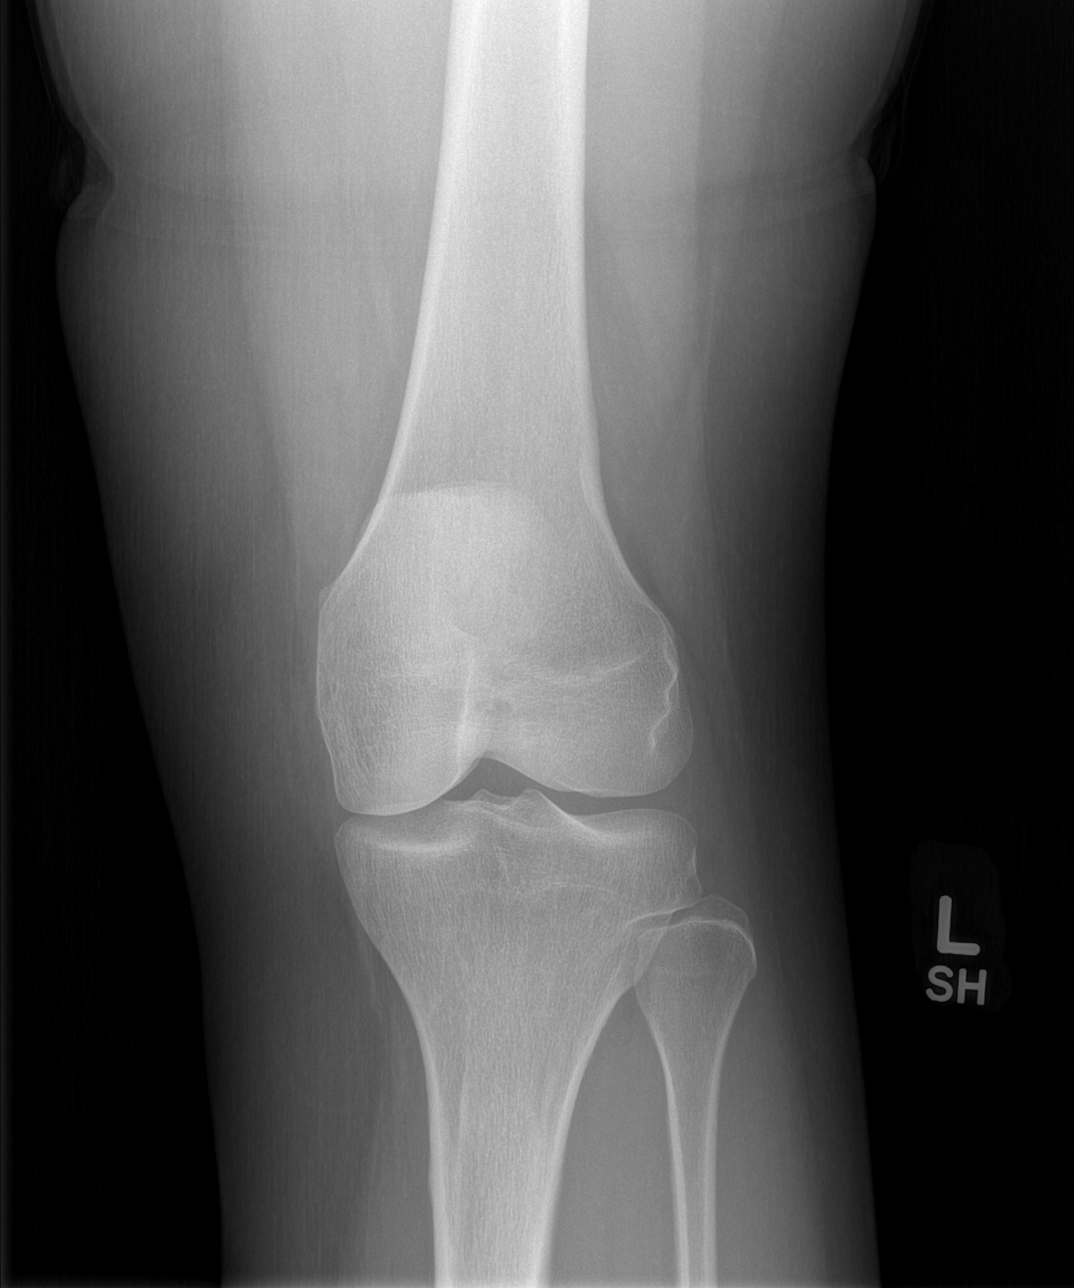

[t knee oblique left (1 of 2)]
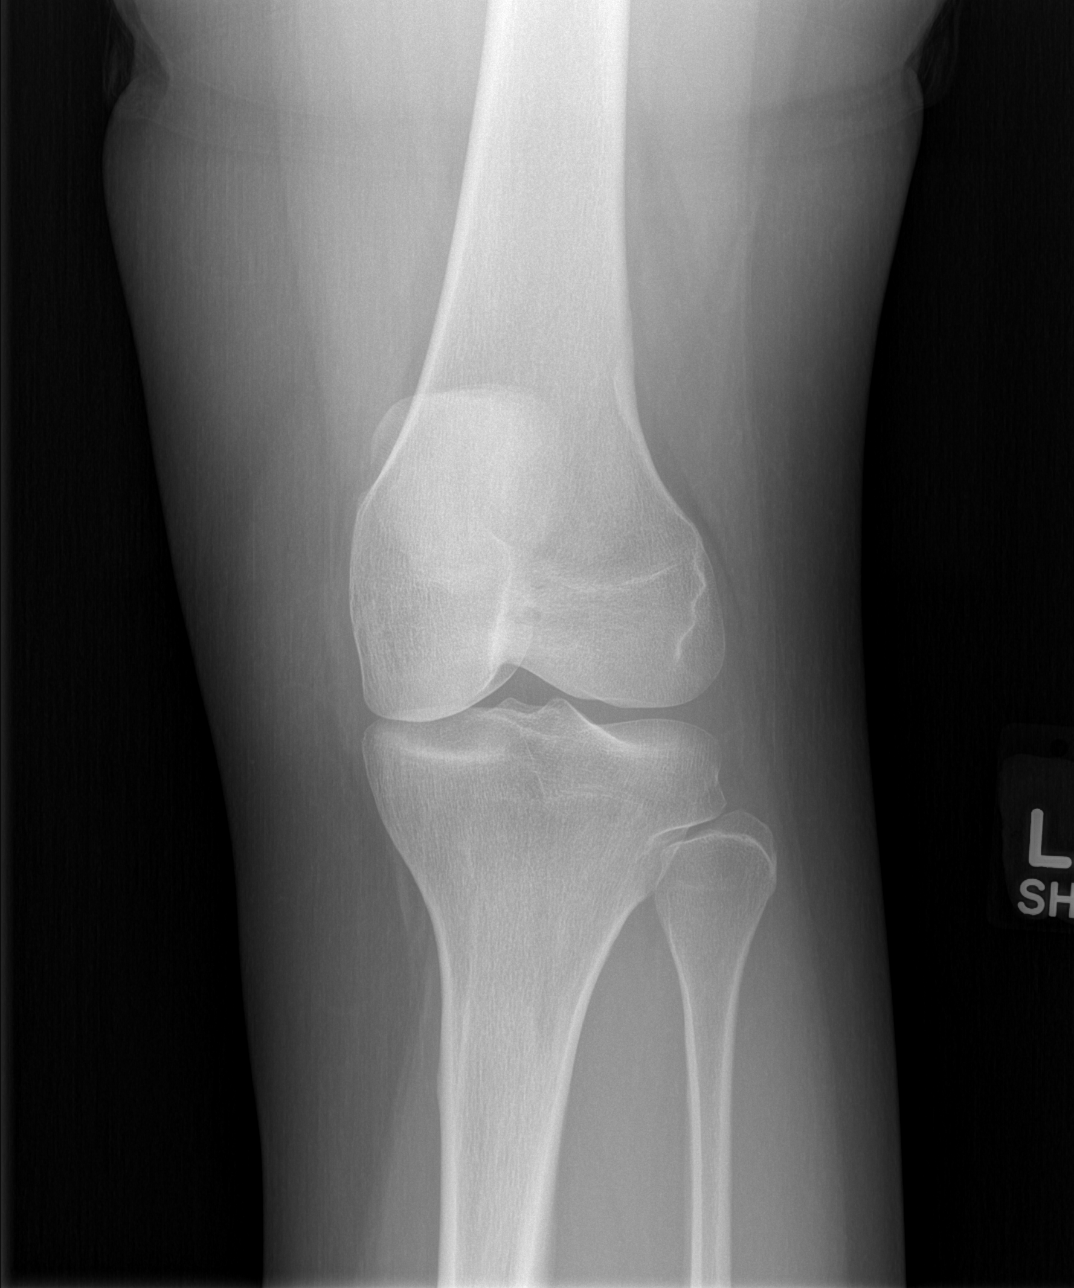

[t knee oblique left (2 of 2)]
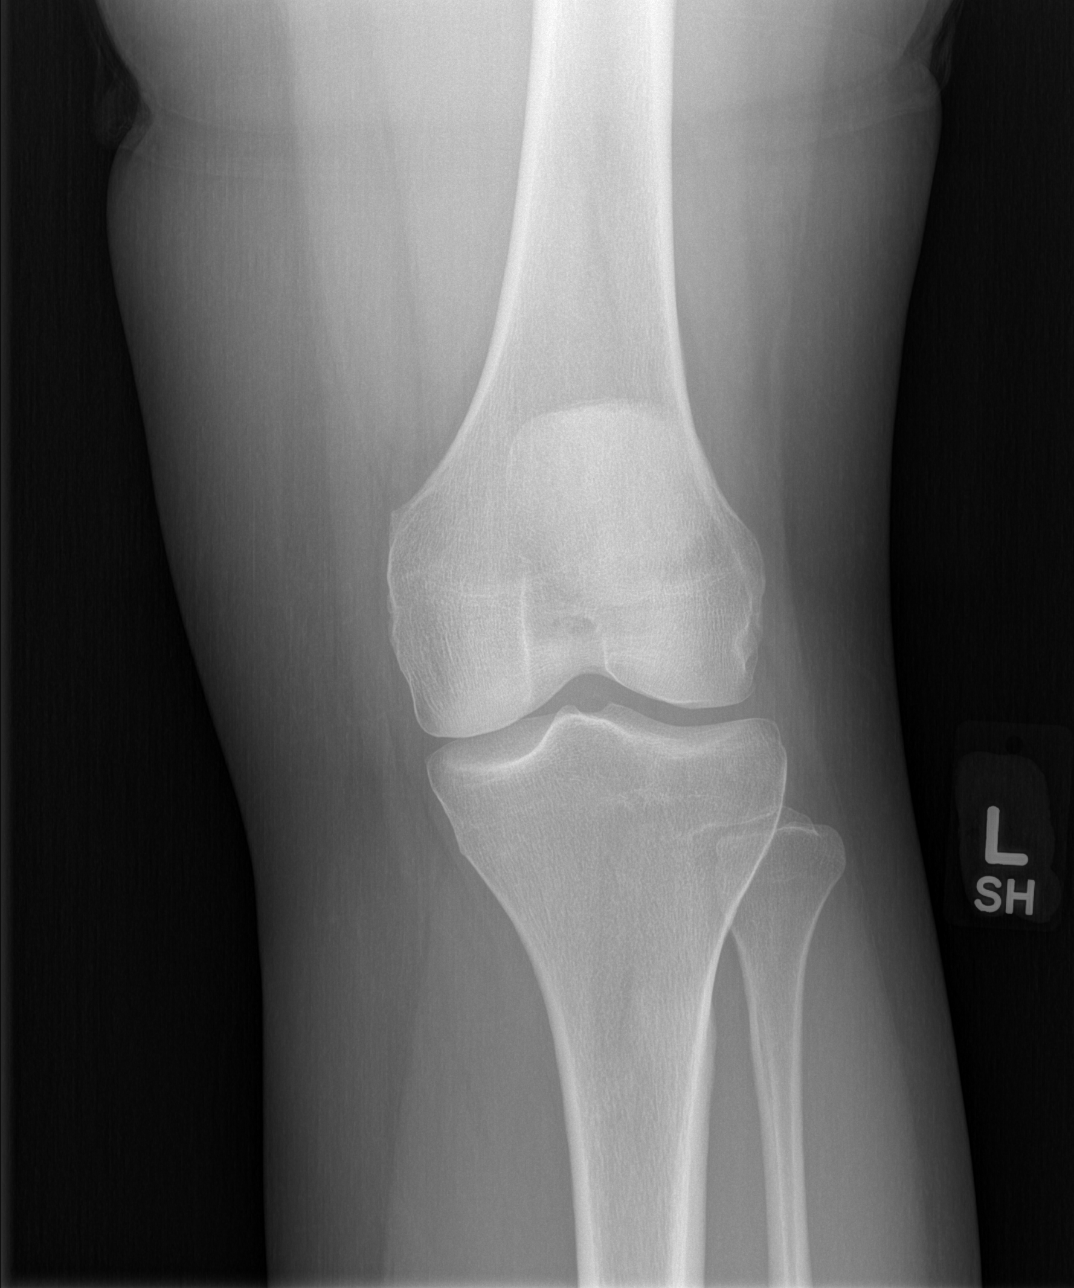

[t knee lat left]
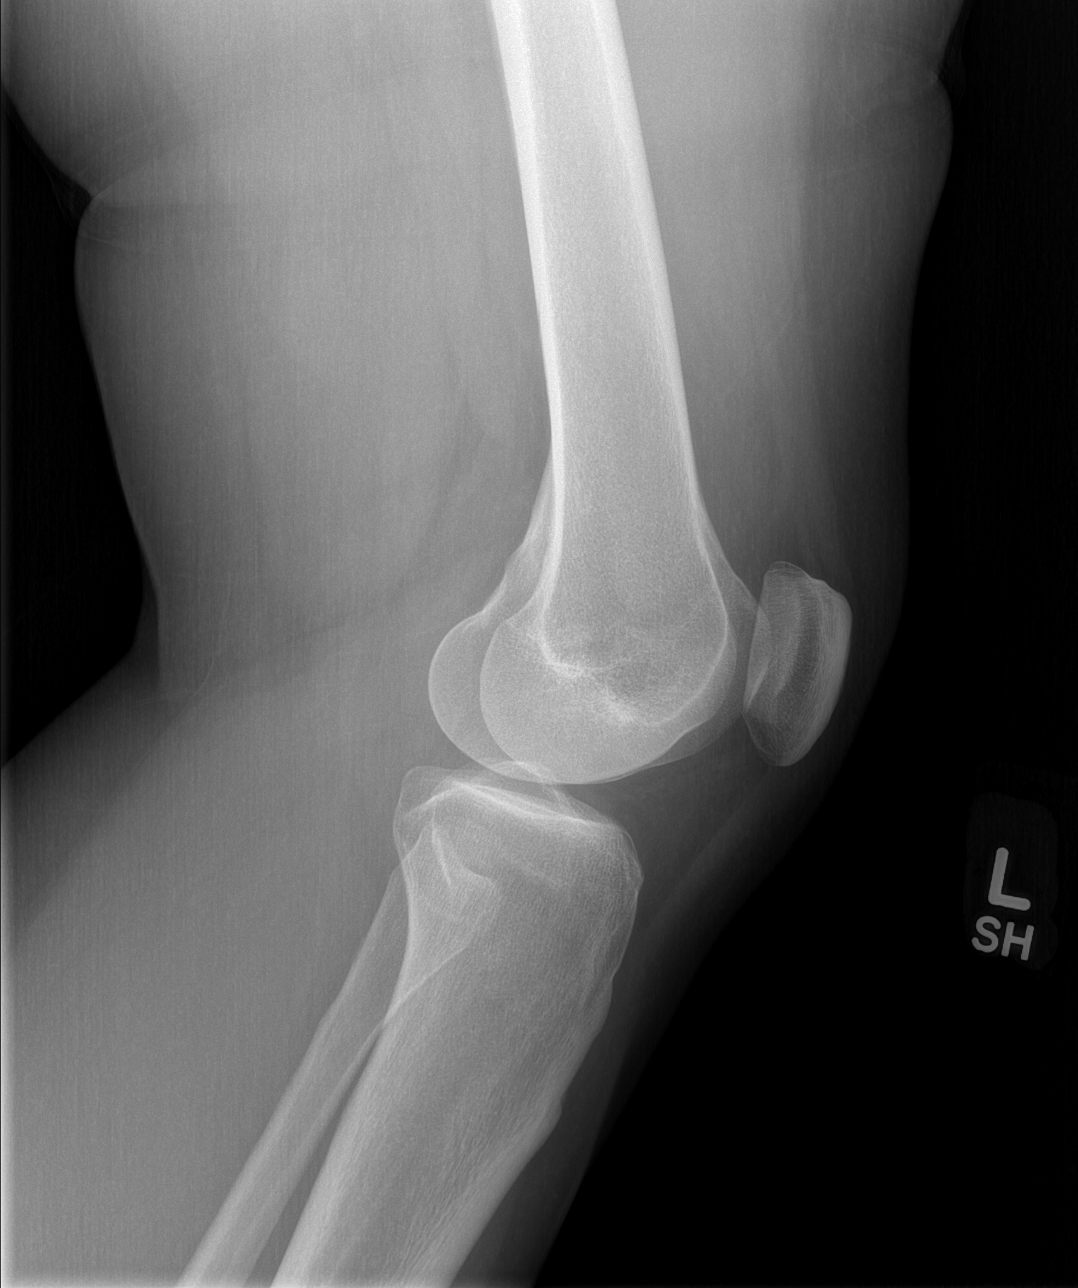

[4 of 4 positions shown; findings below may reference images not displayed]

FINDINGS: No acute bony or joint abnormality. No evidence of fracture
dislocation. No effusion noted.
IMPRESSION: No acute abnormality.

## 2021-12-16 DIAGNOSIS — F411 Generalized anxiety disorder: Secondary | ICD-10-CM | POA: Diagnosis not present

## 2022-02-17 ENCOUNTER — Emergency Department (HOSPITAL_BASED_OUTPATIENT_CLINIC_OR_DEPARTMENT_OTHER)
Admission: EM | Admit: 2022-02-17 | Discharge: 2022-02-17 | Disposition: A | Discharge status: Home / Self Care | Payer: BC Managed Care – PPO | Attending: Emergency Medicine | Admitting: Emergency Medicine

## 2022-02-17 ENCOUNTER — Emergency Department (HOSPITAL_BASED_OUTPATIENT_CLINIC_OR_DEPARTMENT_OTHER): Payer: BC Managed Care – PPO

## 2022-02-17 ENCOUNTER — Encounter (HOSPITAL_BASED_OUTPATIENT_CLINIC_OR_DEPARTMENT_OTHER): Payer: Self-pay | Admitting: *Deleted

## 2022-02-17 ENCOUNTER — Other Ambulatory Visit: Payer: Self-pay

## 2022-02-17 DIAGNOSIS — R2241 Localized swelling, mass and lump, right lower limb: Secondary | ICD-10-CM | POA: Diagnosis not present

## 2022-02-17 DIAGNOSIS — M7989 Other specified soft tissue disorders: Secondary | ICD-10-CM

## 2022-02-17 MED ORDER — IBUPROFEN 800 MG PO TABS
800.0000 mg | ORAL_TABLET | Freq: Once | ORAL | Status: AC
  Administered 2022-02-17: 800 mg via ORAL
  Filled 2022-02-17: qty 1

## 2022-02-17 NOTE — ED Triage Notes (Addendum)
Pt with hx of DVT would like to be evaluated for DVT on right leg.  Pt has had recent long distance car travel and has been having right ankle and leg pain and swelling. No SOB

## 2022-02-17 NOTE — ED Notes (Signed)
Patient transported to Ultrasound 

## 2022-02-17 NOTE — ED Provider Notes (Signed)
[facility] EMERGENCY DEPT Provider Note   CSN: [id number] Arrival date & time: 02/17/22  0731     History  Chief Complaint  Patient presents with   Leg Swelling    Kara Duffy is a 46 y.o. female.  HPI 46 year old female presents today complaining of pain and swelling to right lower extremity.  She has a history of a provoked DVT over 10 years ago after surgery.  She reports at that time she was on blood thinners for approximately 3 months.  She has had no problems since that time.  She went on a car trip to [facility] last weekend.  She wore compression socks.  However she noticed increased pain and swelling in her right lower extremity since that time.  She is not having any chest pain or dyspnea.  She has not had any redness, injury, fever, or chills    Home Medications Prior to Admission medications   Medication Sig Start Date End Date Taking? Authorizing Provider  amLODipine (NORVASC) 5 MG tablet Take 1 tablet by mouth daily.    [provider]  cetirizine (ZYRTEC) 10 MG tablet Take 5 mg by mouth daily.    [provider]  Cholecalciferol (VITAMIN D-3) 25 MCG (1000 UT) CAPS Take 3,000 Units by mouth daily.    [provider]  erythromycin ophthalmic ointment Place a 1/2 inch ribbon of ointment into the lower eyelid 4 times daily for 7 days. 10/11/21   Teodora Medici, FNP  fluticasone (FLONASE) 50 MCG/ACT nasal spray Place 1 spray into both nostrils at bedtime. 02/07/19   Rigoberto Noel, MD  ibuprofen (ADVIL,MOTRIN) 800 MG tablet Take 1 tablet (800 mg total) by mouth every 8 (eight) hours as needed. 04/14/18   Ena Dawley, MD  Multiple Vitamins-Minerals (EMERGEN-C VITAMIN C PO) Take 1 tablet by mouth daily as needed (for immune system).    [provider]  valACYclovir (VALTREX) 500 MG tablet Take 500 mg by mouth daily. 11/10/13   [provider]      Allergies    Patient has no known allergies.     Review of Systems   Review of Systems  Physical Exam Updated Vital Signs BP (!) 122/102   Pulse 76   Temp 98.1 F (36.7 C) (Oral)   Resp 16   Wt 106.1 kg   LMP 03/30/2014 (Exact Date)   SpO2 98%   BMI 37.77 kg/m  Physical Exam Vitals and nursing note reviewed.  Constitutional:      General: She is not in acute distress.    Appearance: She is well-developed.  HENT:     Head: Normocephalic and atraumatic.     Right Ear: External ear normal.     Left Ear: External ear normal.     Nose: Nose normal.  Eyes:     Conjunctiva/sclera: Conjunctivae normal.     Pupils: Pupils are equal, round, and reactive to light.  Cardiovascular:     Rate and Rhythm: Normal rate and regular rhythm.  Pulmonary:     Effort: Pulmonary effort is normal.     Breath sounds: Normal breath sounds.  Abdominal:     General: Abdomen is flat. Bowel sounds are normal.     Palpations: Abdomen is soft.  Musculoskeletal:        General: Swelling present. Normal range of motion.     Cervical back: Normal range of motion and neck supple.     Comments: Right lower extremity appears somewhat  swollen relative to left There is a area of devious injury to the right anterior leg that shows some mild discoloration Pulses are intact She is mildly tender along the posterior calf and medial upper leg  Skin:    General: Skin is warm and dry.  Neurological:     Mental Status: She is alert and oriented to person, place, and time.     Motor: No abnormal muscle tone.     Coordination: Coordination normal.  Psychiatric:        Behavior: Behavior normal.        Thought Content: Thought content normal.     ED Results / Procedures / Treatments   Labs (all labs ordered are listed, but only abnormal results are displayed) Labs Reviewed - No data to display  EKG None  Radiology US Venous Img Lower Unilateral Right (DVT)  Result Date: 02/17/2022 CLINICAL DATA:  Right lower extremity swelling EXAM: RIGHT LOWER  EXTREMITY VENOUS DOPPLER ULTRASOUND TECHNIQUE: Gray-scale sonography with graded compression, as well as color Doppler and duplex ultrasound were performed to evaluate the lower extremity deep venous systems from the level of the common femoral vein and including the common femoral, femoral, profunda femoral, popliteal and calf veins including the posterior tibial, peroneal and gastrocnemius veins when visible. The superficial great saphenous vein was also interrogated. Spectral Doppler was utilized to evaluate flow at rest and with distal augmentation maneuvers in the common femoral, femoral and popliteal veins. COMPARISON:  None Available. FINDINGS: Contralateral Common Femoral Vein: Respiratory phasicity is normal and symmetric with the symptomatic side. No evidence of thrombus. Normal compressibility. Common Femoral Vein: No evidence of thrombus. Normal compressibility, respiratory phasicity and response to augmentation. Saphenofemoral Junction: No evidence of thrombus. Normal compressibility and flow on color Doppler imaging. Profunda Femoral Vein: No evidence of thrombus. Normal compressibility and flow on color Doppler imaging. Femoral Vein: No evidence of thrombus. Normal compressibility, respiratory phasicity and response to augmentation. Popliteal Vein: No evidence of thrombus. Normal compressibility, respiratory phasicity and response to augmentation. Calf Veins: No evidence of thrombus. Normal compressibility and flow on color Doppler imaging. Other Findings:  None. IMPRESSION: No evidence of deep venous thrombosis. Electronically Signed   By: Albin Felling M.D.   On: 02/17/2022 08:56    Procedures Procedures    Medications Ordered in ED Medications  ibuprofen (ADVIL) tablet 800 mg (800 mg Oral Given 02/17/22 0807)    ED Course/ Medical Decision Making/ A&P                           Medical Decision Making 46 yo female ho dvt with some swelling rle. Patient evaluated for dvt here in ed  and none noted No evidence of infection or trauma Plan conservative therapy with continued compression, elevation, and nonsteroidals. We have discussed return precautions and need for follow-up and she voices understanding.  Amount and/or Complexity of Data Reviewed Radiology: ordered and independent interpretation performed. Decision-making details documented in ED Course.  Risk Prescription drug management.           Final Clinical Impression(s) / ED Diagnoses Final diagnoses:  Leg swelling    Rx / DC Orders ED Discharge Orders     None         Pattricia Boss, MD 02/17/22 [phone]

## 2022-02-17 NOTE — Discharge Instructions (Signed)
Please continue compression stocking, elevation, and anti-inflammatory medicine. Return if it becomes red, has increased swelling, you have chest pain or shortness of breath. Please follow-up with your doctor within the next 1 to 2 weeks

## 2022-04-07 DIAGNOSIS — M5431 Sciatica, right side: Secondary | ICD-10-CM | POA: Diagnosis not present

## 2022-04-07 DIAGNOSIS — J3489 Other specified disorders of nose and nasal sinuses: Secondary | ICD-10-CM | POA: Diagnosis not present

## 2022-04-07 DIAGNOSIS — R0981 Nasal congestion: Secondary | ICD-10-CM | POA: Diagnosis not present

## 2022-04-14 DIAGNOSIS — F411 Generalized anxiety disorder: Secondary | ICD-10-CM | POA: Diagnosis not present

## 2022-05-01 DIAGNOSIS — Z6838 Body mass index (BMI) 38.0-38.9, adult: Secondary | ICD-10-CM | POA: Diagnosis not present

## 2022-05-01 DIAGNOSIS — Z113 Encounter for screening for infections with a predominantly sexual mode of transmission: Secondary | ICD-10-CM | POA: Diagnosis not present

## 2022-05-01 DIAGNOSIS — Z1231 Encounter for screening mammogram for malignant neoplasm of breast: Secondary | ICD-10-CM | POA: Diagnosis not present

## 2022-05-01 DIAGNOSIS — N951 Menopausal and female climacteric states: Secondary | ICD-10-CM | POA: Diagnosis not present

## 2022-05-01 DIAGNOSIS — Z01411 Encounter for gynecological examination (general) (routine) with abnormal findings: Secondary | ICD-10-CM | POA: Diagnosis not present

## 2022-05-08 ENCOUNTER — Telehealth: Payer: Self-pay

## 2022-05-08 NOTE — Patient Outreach (Signed)
  Care Coordination   05/08/2022 Name: Kara Duffy MRN: [id number] DOB: 1976-01-16   Care Coordination Outreach Attempts:  An unsuccessful telephone outreach was attempted today to offer the patient information about available care coordination services as a benefit of their health plan.   Follow Up Plan:  Additional outreach attempts will be made to offer the patient care coordination information and services.   Encounter Outcome:  No Answer   Care Coordination Interventions:  No, not indicated    Jone Baseman, RN, MSN [facility] Care Management Coordinator Direct Line [phone]

## 2022-05-12 DIAGNOSIS — I83892 Varicose veins of left lower extremities with other complications: Secondary | ICD-10-CM | POA: Diagnosis not present

## 2022-05-20 DIAGNOSIS — F411 Generalized anxiety disorder: Secondary | ICD-10-CM | POA: Diagnosis not present

## 2022-05-26 ENCOUNTER — Telehealth: Payer: Self-pay

## 2022-05-26 NOTE — Patient Instructions (Signed)
Visit Information  Thank you for taking time to visit with me today. Please don't hesitate to contact me if I can be of assistance to you.   Following are the goals we discussed today:   Goals Addressed             This Visit's Progress    COMPLETED: Care Coordiantion Activities-No follow up required       Care Coordination Interventions: Advised patient to Annual Wellness exam. Discussed [facility] services and support. Assessed SDOH. Advised to discuss with primary care physician if services needed in the future.        ]If you are experiencing a Mental Health or [place] or need someone to talk to, please call the Suicide and Crisis Lifeline: 988   The patient verbalized understanding of instructions, educational materials, and care plan provided today and DECLINED offer to receive copy of patient instructions, educational materials, and care plan.   No further follow up required:    Jone Baseman, RN, MSN [facility] Care Management Coordinator Direct Line [phone]

## 2022-05-26 NOTE — Patient Outreach (Signed)
  Care Coordination   Initial Visit Note   05/26/2022 Name: Kara Duffy MRN: [id number] DOB: 02-11-1976  Kara Duffy is a 47 y.o. year old female who sees Deland Pretty, MD for primary care. I spoke with  Kara Duffy by phone today.  What matters to the patients health and wellness today?  Follow up with vein specialist about leg pain    Goals Addressed             This Visit's Progress    COMPLETED: Care Coordiantion Activities-No follow up required       Care Coordination Interventions: Advised patient to Annual Wellness exam. Discussed [facility] services and support. Assessed SDOH. Advised to discuss with primary care physician if services needed in the future.        SDOH assessments and interventions completed:  Yes  SDOH Interventions Today    Flowsheet Row Most Recent Value  SDOH Interventions   Transportation Interventions Intervention Not Indicated  Utilities Interventions Intervention Not Indicated        Care Coordination Interventions:  Yes, provided   Follow up plan: No further intervention required.   Encounter Outcome:  Pt. Visit Completed   Jone Baseman, RN, MSN [facility] Care Management Coordinator Direct Line [phone]

## 2022-05-27 DIAGNOSIS — F411 Generalized anxiety disorder: Secondary | ICD-10-CM | POA: Diagnosis not present

## 2022-06-02 DIAGNOSIS — I10 Essential (primary) hypertension: Secondary | ICD-10-CM | POA: Diagnosis not present

## 2022-06-02 DIAGNOSIS — Z0000 Encounter for general adult medical examination without abnormal findings: Secondary | ICD-10-CM | POA: Diagnosis not present

## 2022-06-02 DIAGNOSIS — R5383 Other fatigue: Secondary | ICD-10-CM | POA: Diagnosis not present

## 2022-06-02 DIAGNOSIS — E559 Vitamin D deficiency, unspecified: Secondary | ICD-10-CM | POA: Diagnosis not present

## 2022-06-04 DIAGNOSIS — F411 Generalized anxiety disorder: Secondary | ICD-10-CM | POA: Diagnosis not present

## 2022-06-09 DIAGNOSIS — Z0000 Encounter for general adult medical examination without abnormal findings: Secondary | ICD-10-CM | POA: Diagnosis not present

## 2022-06-09 DIAGNOSIS — Z23 Encounter for immunization: Secondary | ICD-10-CM | POA: Diagnosis not present

## 2022-06-09 DIAGNOSIS — I10 Essential (primary) hypertension: Secondary | ICD-10-CM | POA: Diagnosis not present

## 2022-06-09 DIAGNOSIS — R7303 Prediabetes: Secondary | ICD-10-CM | POA: Diagnosis not present

## 2022-06-09 DIAGNOSIS — I739 Peripheral vascular disease, unspecified: Secondary | ICD-10-CM | POA: Diagnosis not present

## 2022-06-24 DIAGNOSIS — I87391 Chronic venous hypertension (idiopathic) with other complications of right lower extremity: Secondary | ICD-10-CM | POA: Diagnosis not present

## 2022-07-24 DIAGNOSIS — J069 Acute upper respiratory infection, unspecified: Secondary | ICD-10-CM | POA: Diagnosis not present

## 2022-08-05 DIAGNOSIS — R051 Acute cough: Secondary | ICD-10-CM | POA: Diagnosis not present

## 2022-08-05 DIAGNOSIS — J029 Acute pharyngitis, unspecified: Secondary | ICD-10-CM | POA: Diagnosis not present

## 2022-08-05 DIAGNOSIS — R5383 Other fatigue: Secondary | ICD-10-CM | POA: Diagnosis not present

## 2022-09-02 DIAGNOSIS — Z113 Encounter for screening for infections with a predominantly sexual mode of transmission: Secondary | ICD-10-CM | POA: Diagnosis not present

## 2022-09-02 DIAGNOSIS — N898 Other specified noninflammatory disorders of vagina: Secondary | ICD-10-CM | POA: Diagnosis not present

## 2022-09-02 DIAGNOSIS — R3915 Urgency of urination: Secondary | ICD-10-CM | POA: Diagnosis not present

## 2022-09-23 DIAGNOSIS — F411 Generalized anxiety disorder: Secondary | ICD-10-CM | POA: Diagnosis not present

## 2022-10-01 DIAGNOSIS — F411 Generalized anxiety disorder: Secondary | ICD-10-CM | POA: Diagnosis not present

## 2022-10-05 DIAGNOSIS — G629 Polyneuropathy, unspecified: Secondary | ICD-10-CM | POA: Diagnosis not present

## 2022-10-05 DIAGNOSIS — Z8249 Family history of ischemic heart disease and other diseases of the circulatory system: Secondary | ICD-10-CM | POA: Diagnosis not present

## 2022-10-05 DIAGNOSIS — M7989 Other specified soft tissue disorders: Secondary | ICD-10-CM | POA: Diagnosis not present

## 2022-10-07 DIAGNOSIS — R600 Localized edema: Secondary | ICD-10-CM | POA: Diagnosis not present

## 2022-10-07 DIAGNOSIS — F411 Generalized anxiety disorder: Secondary | ICD-10-CM | POA: Diagnosis not present

## 2022-10-15 DIAGNOSIS — F411 Generalized anxiety disorder: Secondary | ICD-10-CM | POA: Diagnosis not present

## 2022-10-21 DIAGNOSIS — F411 Generalized anxiety disorder: Secondary | ICD-10-CM | POA: Diagnosis not present

## 2022-12-02 DIAGNOSIS — F411 Generalized anxiety disorder: Secondary | ICD-10-CM | POA: Diagnosis not present

## 2022-12-09 DIAGNOSIS — F411 Generalized anxiety disorder: Secondary | ICD-10-CM | POA: Diagnosis not present

## 2022-12-16 DIAGNOSIS — F330 Major depressive disorder, recurrent, mild: Secondary | ICD-10-CM | POA: Diagnosis not present

## 2022-12-30 DIAGNOSIS — F411 Generalized anxiety disorder: Secondary | ICD-10-CM | POA: Diagnosis not present

## 2023-01-06 DIAGNOSIS — I83893 Varicose veins of bilateral lower extremities with other complications: Secondary | ICD-10-CM | POA: Diagnosis not present

## 2023-01-27 DIAGNOSIS — F411 Generalized anxiety disorder: Secondary | ICD-10-CM | POA: Diagnosis not present

## 2023-02-02 DIAGNOSIS — I83893 Varicose veins of bilateral lower extremities with other complications: Secondary | ICD-10-CM | POA: Diagnosis not present

## 2023-02-03 DIAGNOSIS — F411 Generalized anxiety disorder: Secondary | ICD-10-CM | POA: Diagnosis not present

## 2023-02-10 DIAGNOSIS — F411 Generalized anxiety disorder: Secondary | ICD-10-CM | POA: Diagnosis not present

## 2023-02-24 DIAGNOSIS — F411 Generalized anxiety disorder: Secondary | ICD-10-CM | POA: Diagnosis not present

## 2023-03-03 DIAGNOSIS — F411 Generalized anxiety disorder: Secondary | ICD-10-CM | POA: Diagnosis not present

## 2023-03-17 DIAGNOSIS — F411 Generalized anxiety disorder: Secondary | ICD-10-CM | POA: Diagnosis not present

## 2023-03-25 DIAGNOSIS — F411 Generalized anxiety disorder: Secondary | ICD-10-CM | POA: Diagnosis not present

## 2023-04-01 DIAGNOSIS — F411 Generalized anxiety disorder: Secondary | ICD-10-CM | POA: Diagnosis not present

## 2023-04-07 DIAGNOSIS — F411 Generalized anxiety disorder: Secondary | ICD-10-CM | POA: Diagnosis not present

## 2023-04-07 DIAGNOSIS — E559 Vitamin D deficiency, unspecified: Secondary | ICD-10-CM | POA: Diagnosis not present

## 2023-04-07 DIAGNOSIS — Z23 Encounter for immunization: Secondary | ICD-10-CM | POA: Diagnosis not present

## 2023-04-07 DIAGNOSIS — I998 Other disorder of circulatory system: Secondary | ICD-10-CM | POA: Diagnosis not present

## 2023-04-07 DIAGNOSIS — M79605 Pain in left leg: Secondary | ICD-10-CM | POA: Diagnosis not present

## 2023-04-07 DIAGNOSIS — M79604 Pain in right leg: Secondary | ICD-10-CM | POA: Diagnosis not present

## 2023-04-07 DIAGNOSIS — R600 Localized edema: Secondary | ICD-10-CM | POA: Diagnosis not present

## 2023-04-07 DIAGNOSIS — M791 Myalgia, unspecified site: Secondary | ICD-10-CM | POA: Diagnosis not present

## 2023-04-07 DIAGNOSIS — I83893 Varicose veins of bilateral lower extremities with other complications: Secondary | ICD-10-CM | POA: Diagnosis not present

## 2023-04-14 DIAGNOSIS — F411 Generalized anxiety disorder: Secondary | ICD-10-CM | POA: Diagnosis not present

## 2023-04-28 DIAGNOSIS — F411 Generalized anxiety disorder: Secondary | ICD-10-CM | POA: Diagnosis not present

## 2023-04-29 DIAGNOSIS — Z1231 Encounter for screening mammogram for malignant neoplasm of breast: Secondary | ICD-10-CM | POA: Diagnosis not present

## 2023-04-29 DIAGNOSIS — N898 Other specified noninflammatory disorders of vagina: Secondary | ICD-10-CM | POA: Diagnosis not present

## 2023-04-29 DIAGNOSIS — Z01411 Encounter for gynecological examination (general) (routine) with abnormal findings: Secondary | ICD-10-CM | POA: Diagnosis not present

## 2023-04-29 DIAGNOSIS — Z113 Encounter for screening for infections with a predominantly sexual mode of transmission: Secondary | ICD-10-CM | POA: Diagnosis not present

## 2023-04-29 DIAGNOSIS — N760 Acute vaginitis: Secondary | ICD-10-CM | POA: Diagnosis not present

## 2023-05-05 DIAGNOSIS — F411 Generalized anxiety disorder: Secondary | ICD-10-CM | POA: Diagnosis not present

## 2023-05-12 DIAGNOSIS — F411 Generalized anxiety disorder: Secondary | ICD-10-CM | POA: Diagnosis not present

## 2023-05-26 DIAGNOSIS — F411 Generalized anxiety disorder: Secondary | ICD-10-CM | POA: Diagnosis not present

## 2023-06-02 DIAGNOSIS — M2550 Pain in unspecified joint: Secondary | ICD-10-CM | POA: Diagnosis not present

## 2023-06-02 DIAGNOSIS — M79672 Pain in left foot: Secondary | ICD-10-CM | POA: Diagnosis not present

## 2023-06-02 DIAGNOSIS — R700 Elevated erythrocyte sedimentation rate: Secondary | ICD-10-CM | POA: Diagnosis not present

## 2023-06-02 DIAGNOSIS — M7989 Other specified soft tissue disorders: Secondary | ICD-10-CM | POA: Diagnosis not present

## 2023-06-02 DIAGNOSIS — M549 Dorsalgia, unspecified: Secondary | ICD-10-CM | POA: Diagnosis not present

## 2023-06-02 DIAGNOSIS — F411 Generalized anxiety disorder: Secondary | ICD-10-CM | POA: Diagnosis not present

## 2023-06-02 DIAGNOSIS — M25561 Pain in right knee: Secondary | ICD-10-CM | POA: Diagnosis not present

## 2023-06-02 DIAGNOSIS — M25562 Pain in left knee: Secondary | ICD-10-CM | POA: Diagnosis not present

## 2023-06-02 DIAGNOSIS — M79606 Pain in leg, unspecified: Secondary | ICD-10-CM | POA: Diagnosis not present

## 2023-06-02 DIAGNOSIS — M25569 Pain in unspecified knee: Secondary | ICD-10-CM | POA: Diagnosis not present

## 2023-06-02 DIAGNOSIS — M79671 Pain in right foot: Secondary | ICD-10-CM | POA: Diagnosis not present

## 2023-06-03 DIAGNOSIS — F411 Generalized anxiety disorder: Secondary | ICD-10-CM | POA: Diagnosis not present

## 2023-06-09 DIAGNOSIS — F411 Generalized anxiety disorder: Secondary | ICD-10-CM | POA: Diagnosis not present

## 2023-06-10 DIAGNOSIS — N898 Other specified noninflammatory disorders of vagina: Secondary | ICD-10-CM | POA: Diagnosis not present

## 2023-06-10 DIAGNOSIS — Z0000 Encounter for general adult medical examination without abnormal findings: Secondary | ICD-10-CM | POA: Diagnosis not present

## 2023-06-10 DIAGNOSIS — Z1339 Encounter for screening examination for other mental health and behavioral disorders: Secondary | ICD-10-CM | POA: Diagnosis not present

## 2023-06-10 DIAGNOSIS — R7303 Prediabetes: Secondary | ICD-10-CM | POA: Diagnosis not present

## 2023-06-10 DIAGNOSIS — G4700 Insomnia, unspecified: Secondary | ICD-10-CM | POA: Diagnosis not present

## 2023-06-10 DIAGNOSIS — Z113 Encounter for screening for infections with a predominantly sexual mode of transmission: Secondary | ICD-10-CM | POA: Diagnosis not present

## 2023-06-10 DIAGNOSIS — E559 Vitamin D deficiency, unspecified: Secondary | ICD-10-CM | POA: Diagnosis not present

## 2023-06-10 DIAGNOSIS — I10 Essential (primary) hypertension: Secondary | ICD-10-CM | POA: Diagnosis not present

## 2023-06-16 DIAGNOSIS — F411 Generalized anxiety disorder: Secondary | ICD-10-CM | POA: Diagnosis not present

## 2023-06-17 DIAGNOSIS — I998 Other disorder of circulatory system: Secondary | ICD-10-CM | POA: Diagnosis not present

## 2023-06-17 DIAGNOSIS — M79606 Pain in leg, unspecified: Secondary | ICD-10-CM | POA: Diagnosis not present

## 2023-06-17 DIAGNOSIS — I83893 Varicose veins of bilateral lower extremities with other complications: Secondary | ICD-10-CM | POA: Diagnosis not present

## 2023-06-17 DIAGNOSIS — Z0000 Encounter for general adult medical examination without abnormal findings: Secondary | ICD-10-CM | POA: Diagnosis not present

## 2023-06-17 DIAGNOSIS — M2550 Pain in unspecified joint: Secondary | ICD-10-CM | POA: Diagnosis not present

## 2023-06-23 DIAGNOSIS — F411 Generalized anxiety disorder: Secondary | ICD-10-CM | POA: Diagnosis not present

## 2023-06-23 DIAGNOSIS — R202 Paresthesia of skin: Secondary | ICD-10-CM | POA: Diagnosis not present

## 2023-06-23 DIAGNOSIS — M79604 Pain in right leg: Secondary | ICD-10-CM | POA: Diagnosis not present

## 2023-06-30 DIAGNOSIS — F411 Generalized anxiety disorder: Secondary | ICD-10-CM | POA: Diagnosis not present

## 2023-07-14 DIAGNOSIS — F411 Generalized anxiety disorder: Secondary | ICD-10-CM | POA: Diagnosis not present

## 2023-08-05 DIAGNOSIS — R200 Anesthesia of skin: Secondary | ICD-10-CM | POA: Diagnosis not present

## 2023-08-05 DIAGNOSIS — G5731 Lesion of lateral popliteal nerve, right lower limb: Secondary | ICD-10-CM | POA: Diagnosis not present

## 2023-08-11 DIAGNOSIS — F411 Generalized anxiety disorder: Secondary | ICD-10-CM | POA: Diagnosis not present

## 2023-08-12 DIAGNOSIS — M79604 Pain in right leg: Secondary | ICD-10-CM | POA: Diagnosis not present

## 2023-08-18 DIAGNOSIS — F411 Generalized anxiety disorder: Secondary | ICD-10-CM | POA: Diagnosis not present

## 2023-08-25 DIAGNOSIS — N898 Other specified noninflammatory disorders of vagina: Secondary | ICD-10-CM | POA: Diagnosis not present

## 2023-08-31 DIAGNOSIS — R5383 Other fatigue: Secondary | ICD-10-CM | POA: Diagnosis not present

## 2023-08-31 DIAGNOSIS — I739 Peripheral vascular disease, unspecified: Secondary | ICD-10-CM | POA: Diagnosis not present

## 2023-08-31 DIAGNOSIS — R599 Enlarged lymph nodes, unspecified: Secondary | ICD-10-CM | POA: Diagnosis not present

## 2023-08-31 DIAGNOSIS — J029 Acute pharyngitis, unspecified: Secondary | ICD-10-CM | POA: Diagnosis not present

## 2023-09-01 DIAGNOSIS — F411 Generalized anxiety disorder: Secondary | ICD-10-CM | POA: Diagnosis not present

## 2023-09-03 DIAGNOSIS — J0100 Acute maxillary sinusitis, unspecified: Secondary | ICD-10-CM | POA: Diagnosis not present

## 2023-09-08 DIAGNOSIS — F411 Generalized anxiety disorder: Secondary | ICD-10-CM | POA: Diagnosis not present

## 2023-09-15 DIAGNOSIS — F411 Generalized anxiety disorder: Secondary | ICD-10-CM | POA: Diagnosis not present

## 2023-09-22 DIAGNOSIS — F411 Generalized anxiety disorder: Secondary | ICD-10-CM | POA: Diagnosis not present

## 2023-09-29 DIAGNOSIS — F411 Generalized anxiety disorder: Secondary | ICD-10-CM | POA: Diagnosis not present

## 2023-10-06 DIAGNOSIS — F411 Generalized anxiety disorder: Secondary | ICD-10-CM | POA: Diagnosis not present

## 2023-10-13 DIAGNOSIS — F411 Generalized anxiety disorder: Secondary | ICD-10-CM | POA: Diagnosis not present

## 2023-10-20 DIAGNOSIS — F411 Generalized anxiety disorder: Secondary | ICD-10-CM | POA: Diagnosis not present

## 2023-10-27 DIAGNOSIS — F411 Generalized anxiety disorder: Secondary | ICD-10-CM | POA: Diagnosis not present

## 2023-11-17 DIAGNOSIS — F411 Generalized anxiety disorder: Secondary | ICD-10-CM | POA: Diagnosis not present

## 2023-12-02 DIAGNOSIS — F411 Generalized anxiety disorder: Secondary | ICD-10-CM | POA: Diagnosis not present

## 2023-12-08 DIAGNOSIS — F411 Generalized anxiety disorder: Secondary | ICD-10-CM | POA: Diagnosis not present

## 2023-12-15 DIAGNOSIS — I83893 Varicose veins of bilateral lower extremities with other complications: Secondary | ICD-10-CM | POA: Diagnosis not present

## 2023-12-15 DIAGNOSIS — M79606 Pain in leg, unspecified: Secondary | ICD-10-CM | POA: Diagnosis not present

## 2023-12-15 DIAGNOSIS — F419 Anxiety disorder, unspecified: Secondary | ICD-10-CM | POA: Diagnosis not present

## 2023-12-23 DIAGNOSIS — F411 Generalized anxiety disorder: Secondary | ICD-10-CM | POA: Diagnosis not present

## 2023-12-29 DIAGNOSIS — F411 Generalized anxiety disorder: Secondary | ICD-10-CM | POA: Diagnosis not present

## 2024-01-05 DIAGNOSIS — F411 Generalized anxiety disorder: Secondary | ICD-10-CM | POA: Diagnosis not present

## 2024-01-12 DIAGNOSIS — F411 Generalized anxiety disorder: Secondary | ICD-10-CM | POA: Diagnosis not present

## 2024-01-13 DIAGNOSIS — M25521 Pain in right elbow: Secondary | ICD-10-CM | POA: Diagnosis not present

## 2024-01-13 DIAGNOSIS — F411 Generalized anxiety disorder: Secondary | ICD-10-CM | POA: Diagnosis not present

## 2024-01-19 DIAGNOSIS — F411 Generalized anxiety disorder: Secondary | ICD-10-CM | POA: Diagnosis not present

## 2024-03-03 ENCOUNTER — Other Ambulatory Visit: Payer: Self-pay | Admitting: Obstetrics and Gynecology

## 2024-03-03 DIAGNOSIS — Z1231 Encounter for screening mammogram for malignant neoplasm of breast: Secondary | ICD-10-CM

## 2024-05-31 ENCOUNTER — Ambulatory Visit
Admission: RE | Admit: 2024-05-31 | Discharge: 2024-05-31 | Disposition: A | Discharge status: Home / Self Care | Source: Ambulatory Visit | Attending: Obstetrics and Gynecology | Admitting: Obstetrics and Gynecology

## 2024-05-31 DIAGNOSIS — Z1231 Encounter for screening mammogram for malignant neoplasm of breast: Secondary | ICD-10-CM
